# Patient Record
Sex: Female | Born: 1965 | Race: White | Hispanic: No | Marital: Married | Smoking: Never smoker
Health system: Southern US, Community
[De-identification: ages and names within clinical notes are randomized; demographics above are authoritative.]

## PROBLEM LIST (undated history)

## (undated) DIAGNOSIS — K5792 Diverticulitis of intestine, part unspecified, without perforation or abscess without bleeding: Secondary | ICD-10-CM

## (undated) HISTORY — PX: TONSILLECTOMY: SUR1361

## (undated) HISTORY — PX: RADICAL HYSTERECTOMY: SHX2283

## (undated) HISTORY — PX: RHINOPLASTY: SUR1284

---

## 2005-12-01 ENCOUNTER — Other Ambulatory Visit: Payer: Self-pay

## 2005-12-01 ENCOUNTER — Inpatient Hospital Stay: Payer: Self-pay | Admitting: Internal Medicine

## 2006-05-12 ENCOUNTER — Ambulatory Visit: Payer: Self-pay | Admitting: Gastroenterology

## 2006-08-28 ENCOUNTER — Ambulatory Visit: Payer: Self-pay | Admitting: Family Medicine

## 2008-06-27 ENCOUNTER — Ambulatory Visit: Payer: Self-pay | Admitting: Obstetrics and Gynecology

## 2008-07-04 ENCOUNTER — Ambulatory Visit: Payer: Self-pay | Admitting: Obstetrics and Gynecology

## 2010-01-03 ENCOUNTER — Inpatient Hospital Stay (HOSPITAL_COMMUNITY): Admission: RE | Admit: 2010-01-03 | Discharge: 2010-01-04 | Payer: Self-pay | Admitting: Neurosurgery

## 2010-09-09 LAB — SURGICAL PCR SCREEN
MRSA, PCR: POSITIVE — AB
Staphylococcus aureus: POSITIVE — AB

## 2010-09-09 LAB — CBC
HCT: 39 % (ref 36.0–46.0)
Hemoglobin: 12.9 g/dL (ref 12.0–15.0)
Platelets: 167 10*3/uL (ref 150–400)
RBC: 4.38 MIL/uL (ref 3.87–5.11)
RDW: 12.6 % (ref 11.5–15.5)

## 2010-09-25 ENCOUNTER — Ambulatory Visit: Payer: Self-pay | Admitting: Obstetrics and Gynecology

## 2012-10-07 ENCOUNTER — Ambulatory Visit: Payer: Self-pay | Admitting: Family Medicine

## 2013-01-12 ENCOUNTER — Ambulatory Visit: Payer: Self-pay | Admitting: Gastroenterology

## 2013-11-24 DIAGNOSIS — E785 Hyperlipidemia, unspecified: Secondary | ICD-10-CM | POA: Insufficient documentation

## 2013-11-24 DIAGNOSIS — N920 Excessive and frequent menstruation with regular cycle: Secondary | ICD-10-CM | POA: Insufficient documentation

## 2013-11-24 DIAGNOSIS — R51 Headache: Secondary | ICD-10-CM

## 2013-11-24 DIAGNOSIS — R519 Headache, unspecified: Secondary | ICD-10-CM | POA: Insufficient documentation

## 2013-11-24 DIAGNOSIS — N83209 Unspecified ovarian cyst, unspecified side: Secondary | ICD-10-CM | POA: Insufficient documentation

## 2014-10-17 ENCOUNTER — Ambulatory Visit
Admit: 2014-10-17 | Disposition: A | Discharge status: Home / Self Care | Payer: Self-pay | Attending: Neurology | Admitting: Neurology

## 2016-11-07 ENCOUNTER — Other Ambulatory Visit: Payer: Self-pay | Admitting: Neurology

## 2016-11-07 ENCOUNTER — Encounter: Payer: Self-pay | Admitting: Emergency Medicine

## 2016-11-07 DIAGNOSIS — R51 Headache: Secondary | ICD-10-CM

## 2016-11-07 DIAGNOSIS — M542 Cervicalgia: Secondary | ICD-10-CM

## 2016-11-07 DIAGNOSIS — R200 Anesthesia of skin: Secondary | ICD-10-CM

## 2016-11-07 DIAGNOSIS — R110 Nausea: Secondary | ICD-10-CM | POA: Diagnosis not present

## 2016-11-07 DIAGNOSIS — R1030 Lower abdominal pain, unspecified: Secondary | ICD-10-CM | POA: Diagnosis present

## 2016-11-07 DIAGNOSIS — K5732 Diverticulitis of large intestine without perforation or abscess without bleeding: Secondary | ICD-10-CM | POA: Diagnosis not present

## 2016-11-07 DIAGNOSIS — R519 Headache, unspecified: Secondary | ICD-10-CM

## 2016-11-07 LAB — URINALYSIS, COMPLETE (UACMP) WITH MICROSCOPIC
BILIRUBIN URINE: NEGATIVE
Bacteria, UA: NONE SEEN
GLUCOSE, UA: NEGATIVE mg/dL
Hgb urine dipstick: NEGATIVE
Ketones, ur: NEGATIVE mg/dL
Nitrite: NEGATIVE
PH: 5 (ref 5.0–8.0)
Protein, ur: NEGATIVE mg/dL
SPECIFIC GRAVITY, URINE: 1.023 (ref 1.005–1.030)

## 2016-11-07 LAB — COMPREHENSIVE METABOLIC PANEL
ALK PHOS: 78 U/L (ref 38–126)
ALT: 18 U/L (ref 14–54)
AST: 24 U/L (ref 15–41)
Albumin: 4.2 g/dL (ref 3.5–5.0)
Anion gap: 6 (ref 5–15)
BILIRUBIN TOTAL: 0.6 mg/dL (ref 0.3–1.2)
BUN: 16 mg/dL (ref 6–20)
CALCIUM: 9.4 mg/dL (ref 8.9–10.3)
CHLORIDE: 103 mmol/L (ref 101–111)
CO2: 30 mmol/L (ref 22–32)
CREATININE: 0.92 mg/dL (ref 0.44–1.00)
Glucose, Bld: 105 mg/dL — ABNORMAL HIGH (ref 65–99)
Potassium: 3.9 mmol/L (ref 3.5–5.1)
Sodium: 139 mmol/L (ref 135–145)
Total Protein: 7.5 g/dL (ref 6.5–8.1)

## 2016-11-07 LAB — LIPASE, BLOOD: Lipase: 28 U/L (ref 11–51)

## 2016-11-07 LAB — CBC
HCT: 42.6 % (ref 35.0–47.0)
Hemoglobin: 14.4 g/dL (ref 12.0–16.0)
MCH: 29.2 pg (ref 26.0–34.0)
MCHC: 33.8 g/dL (ref 32.0–36.0)
MCV: 86.1 fL (ref 80.0–100.0)
PLATELETS: 187 10*3/uL (ref 150–440)
RBC: 4.94 MIL/uL (ref 3.80–5.20)
RDW: 13.3 % (ref 11.5–14.5)
WBC: 11.7 10*3/uL — AB (ref 3.6–11.0)

## 2016-11-07 NOTE — ED Notes (Signed)
Lab requests to resend urine sample, not enough received

## 2016-11-07 NOTE — ED Triage Notes (Signed)
Pt ambulatory to triage with steady gait. Pt presents to ED c/o lower abdominal pain today, Pt reports took magnesium citrate, had 3 normal BM. Pt reports hx of diverticulitis. Pt denies vomiting or diarrhea, denies chest pain.

## 2016-11-08 ENCOUNTER — Emergency Department
Admission: EM | Admit: 2016-11-08 | Discharge: 2016-11-08 | Disposition: A | Discharge status: Home / Self Care | Payer: Managed Care, Other (non HMO) | Attending: Emergency Medicine | Admitting: Emergency Medicine

## 2016-11-08 ENCOUNTER — Emergency Department: Payer: Managed Care, Other (non HMO)

## 2016-11-08 DIAGNOSIS — K5792 Diverticulitis of intestine, part unspecified, without perforation or abscess without bleeding: Secondary | ICD-10-CM

## 2016-11-08 HISTORY — DX: Diverticulitis of intestine, part unspecified, without perforation or abscess without bleeding: K57.92

## 2016-11-08 MED ORDER — MORPHINE SULFATE (PF) 4 MG/ML IV SOLN
4.0000 mg | Freq: Once | INTRAVENOUS | Status: AC
  Administered 2016-11-08: 4 mg via INTRAVENOUS
  Filled 2016-11-08: qty 1

## 2016-11-08 MED ORDER — IOPAMIDOL (ISOVUE-300) INJECTION 61%
30.0000 mL | Freq: Once | INTRAVENOUS | Status: AC | PRN
  Administered 2016-11-08: 30 mL via ORAL

## 2016-11-08 MED ORDER — METRONIDAZOLE 500 MG PO TABS: 500.0000 mg | ORAL_TABLET | Freq: Two times a day (BID) | ORAL | 0 refills | Status: AC

## 2016-11-08 MED ORDER — CIPROFLOXACIN IN D5W 400 MG/200ML IV SOLN
400.0000 mg | Freq: Once | INTRAVENOUS | Status: AC
  Administered 2016-11-08: 400 mg via INTRAVENOUS
  Filled 2016-11-08: qty 200

## 2016-11-08 MED ORDER — OXYCODONE-ACETAMINOPHEN 5-325 MG PO TABS: 1.0000 | ORAL_TABLET | Freq: Four times a day (QID) | ORAL | 0 refills | Status: DC | PRN

## 2016-11-08 MED ORDER — CIPROFLOXACIN HCL 500 MG PO TABS: 500.0000 mg | ORAL_TABLET | Freq: Two times a day (BID) | ORAL | 0 refills | Status: AC

## 2016-11-08 MED ORDER — OXYCODONE-ACETAMINOPHEN 5-325 MG PO TABS
1.0000 | ORAL_TABLET | Freq: Once | ORAL | Status: AC
  Administered 2016-11-08: 1 via ORAL
  Filled 2016-11-08: qty 1

## 2016-11-08 MED ORDER — IOPAMIDOL (ISOVUE-300) INJECTION 61%
100.0000 mL | Freq: Once | INTRAVENOUS | Status: AC | PRN
  Administered 2016-11-08: 100 mL via INTRAVENOUS

## 2016-11-08 MED ORDER — ONDANSETRON HCL 4 MG/2ML IJ SOLN
4.0000 mg | Freq: Once | INTRAMUSCULAR | Status: AC
  Administered 2016-11-08: 4 mg via INTRAVENOUS
  Filled 2016-11-08: qty 2

## 2016-11-08 MED ORDER — METRONIDAZOLE 500 MG PO TABS
500.0000 mg | ORAL_TABLET | Freq: Once | ORAL | Status: AC
  Administered 2016-11-08: 500 mg via ORAL
  Filled 2016-11-08: qty 1

## 2016-11-08 NOTE — ED Notes (Signed)
CT tech, Pam, notified pt is finished drinking contrast for CT test.

## 2016-11-08 NOTE — Discharge Instructions (Signed)
Please return with worsening pain or vomiting

## 2016-11-08 NOTE — ED Provider Notes (Signed)
[facility] Emergency Department Provider Note   ____________________________________________   First MD Initiated Contact with Patient 11/08/16 0231     (approximate)  I have reviewed the triage vital signs and the nursing notes.   HISTORY  Chief Complaint Abdominal Pain    HPI Katie Sanchez is a 51 y.o. female who comes into the hospital today with abdominal pain. She is unsure if she has a bowel obstruction or if it's due to diverticulitis. She's had some sharp pains throughout the bottom of her stomach. The patient states symptoms started 1-2 days ago. The patient reports that she's had some neck issues going on as well. The patient hasn't taken anything for pain at home. She reports that she took some magnesium citrate and had 3 bowel movements. She reports that the pain did not improve. The patient was up in 8 out of 10 in intensity. She's had some nausea with no vomiting. The patient denies any fever, lightheadedness, dizziness. She reports his never been like this across her abdomen. She reports that the pain is worse on the right greater than left.   Past Medical History:  Diagnosis Date  . Diverticulitis     There are no active problems to display for this patient.   Past Surgical History:  Procedure Laterality Date  . RADICAL HYSTERECTOMY    . RHINOPLASTY    . TONSILLECTOMY      Prior to Admission medications   Medication Sig Start Date End Date Taking? Authorizing Provider  ciprofloxacin (CIPRO) 500 MG tablet Take 1 tablet (500 mg total) by mouth 2 (two) times daily. 11/08/16 11/15/16  Rebecka ApleyWebster, Daquane Aguilar P, MD  metroNIDAZOLE (FLAGYL) 500 MG tablet Take 1 tablet (500 mg total) by mouth 2 (two) times daily. 11/08/16 11/15/16  Rebecka ApleyWebster, Stanley Lyness P, MD  oxyCODONE-acetaminophen (ROXICET) 5-325 MG tablet Take 1 tablet by mouth every 6 (six) hours as needed. 11/08/16   Rebecka ApleyWebster, Kearsten Ginther P, MD    Allergies Augmentin [amoxicillin-pot  clavulanate]  No family history on file.  Social History Social History  Substance Use Topics  . Smoking status: Never Smoker  . Smokeless tobacco: Never Used  . Alcohol use Yes    Review of Systems  Constitutional: No fever/chills Eyes: No visual changes. ENT: No sore throat. Cardiovascular: Denies chest pain. Respiratory: Denies shortness of breath. Gastrointestinal: abdominal pain.  nausea, no vomiting.  No diarrhea.  No constipation. Genitourinary: Negative for dysuria. Musculoskeletal: Negative for back pain. Skin: Negative for rash. Neurological: Negative for headaches, focal weakness or numbness.   ____________________________________________   PHYSICAL EXAM:  VITAL SIGNS: ED Triage Vitals  Enc Vitals Group     BP 11/07/16 2227 (!) 152/86     Pulse Rate 11/07/16 2227 90     Resp 11/07/16 2227 18     Temp 11/07/16 2227 98.6 F (37 C)     Temp Source 11/07/16 2227 Oral     SpO2 11/07/16 2227 97 %     Weight 11/07/16 2228 158 lb (71.7 kg)     Height 11/07/16 2228 5\' 4"  (1.626 m)     Head Circumference --      Peak Flow --      Pain Score 11/07/16 2227 8     Pain Loc --      Pain Edu? --      Excl. in [place]? --     Constitutional: Alert and oriented. Well appearing and in Moderate distress. Eyes: Conjunctivae are normal. PERRL. EOMI.  Head: Atraumatic. Nose: No congestion/rhinnorhea. Mouth/Throat: Mucous membranes are moist.  Oropharynx non-erythematous. Cardiovascular: Normal rate, regular rhythm. Grossly normal heart sounds.  Good peripheral circulation. Respiratory: Normal respiratory effort.  No retractions. Lungs CTAB. Gastrointestinal: Soft with some right lower quadrant and left lower quadrant tenderness to palpation. No distention. Positive bowel sounds Musculoskeletal: No lower extremity tenderness nor edema.   Neurologic:  Normal speech and language.  Skin:  Skin is warm, dry and intact.  Psychiatric: Mood and affect are normal.    ____________________________________________   LABS (all labs ordered are listed, but only abnormal results are displayed)  Labs Reviewed  COMPREHENSIVE METABOLIC PANEL - Abnormal; Notable for the following:       Result Value   Glucose, Bld 105 (*)    All other components within normal limits  CBC - Abnormal; Notable for the following:    WBC 11.7 (*)    All other components within normal limits  URINALYSIS, COMPLETE (UACMP) WITH MICROSCOPIC - Abnormal; Notable for the following:    Color, Urine YELLOW (*)    APPearance CLEAR (*)    Leukocytes, UA SMALL (*)    Squamous Epithelial / LPF 0-5 (*)    All other components within normal limits  LIPASE, BLOOD   ____________________________________________  EKG  none ____________________________________________  RADIOLOGY  CT abd and pelvis ____________________________________________   PROCEDURES  Procedure(s) performed: None  Procedures  Critical Care performed: No  ____________________________________________   INITIAL IMPRESSION / ASSESSMENT AND PLAN / ED COURSE  Pertinent labs & imaging results that were available during my care of the patient were reviewed by me and considered in my medical decision making (see chart for details).  This is a 51 year old female who comes into the hospital today with abdominal pain. The patient received a CT scan and appears that she has some diverticulitis. She reports that after drinking the contrast she did vomit but she thinks it was discussed it was so much. The patient reports that she wants to attempt going home. I did give the patient is dose of Flagyl and ciprofloxacin. The patient will be discharged home to follow-up. I did give her dose of Percocet prior to her discharge.      ____________________________________________   FINAL CLINICAL IMPRESSION(S) / ED DIAGNOSES  Final diagnoses:  Diverticulitis      NEW MEDICATIONS STARTED DURING THIS VISIT:  New  Prescriptions   CIPROFLOXACIN (CIPRO) 500 MG TABLET    Take 1 tablet (500 mg total) by mouth 2 (two) times daily.   METRONIDAZOLE (FLAGYL) 500 MG TABLET    Take 1 tablet (500 mg total) by mouth 2 (two) times daily.   OXYCODONE-ACETAMINOPHEN (ROXICET) 5-325 MG TABLET    Take 1 tablet by mouth every 6 (six) hours as needed.     Note:  This document was prepared using Dragon voice recognition software and may include unintentional dictation errors.    Rebecka Apley, MD 11/08/16 [phone]

## 2016-11-08 NOTE — ED Notes (Signed)

## 2016-11-19 ENCOUNTER — Ambulatory Visit
Admission: RE | Admit: 2016-11-19 | Discharge: 2016-11-19 | Disposition: A | Discharge status: Home / Self Care | Payer: Managed Care, Other (non HMO) | Source: Ambulatory Visit | Attending: Neurology | Admitting: Neurology

## 2016-11-19 DIAGNOSIS — M50321 Other cervical disc degeneration at C4-C5 level: Secondary | ICD-10-CM | POA: Diagnosis not present

## 2016-11-19 DIAGNOSIS — R51 Headache: Secondary | ICD-10-CM | POA: Insufficient documentation

## 2016-11-19 DIAGNOSIS — R519 Headache, unspecified: Secondary | ICD-10-CM

## 2016-11-19 DIAGNOSIS — R200 Anesthesia of skin: Secondary | ICD-10-CM | POA: Diagnosis present

## 2016-11-19 DIAGNOSIS — M542 Cervicalgia: Secondary | ICD-10-CM

## 2016-11-19 DIAGNOSIS — M5382 Other specified dorsopathies, cervical region: Secondary | ICD-10-CM | POA: Diagnosis not present

## 2016-11-19 DIAGNOSIS — Z981 Arthrodesis status: Secondary | ICD-10-CM | POA: Diagnosis not present

## 2017-03-28 ENCOUNTER — Other Ambulatory Visit: Payer: Self-pay | Admitting: Family Medicine

## 2017-03-28 DIAGNOSIS — Z1231 Encounter for screening mammogram for malignant neoplasm of breast: Secondary | ICD-10-CM

## 2017-04-17 ENCOUNTER — Ambulatory Visit
Admission: RE | Admit: 2017-04-17 | Discharge: 2017-04-17 | Disposition: A | Discharge status: Home / Self Care | Payer: Managed Care, Other (non HMO) | Source: Ambulatory Visit | Attending: Family Medicine | Admitting: Family Medicine

## 2017-04-17 DIAGNOSIS — Z1231 Encounter for screening mammogram for malignant neoplasm of breast: Secondary | ICD-10-CM | POA: Insufficient documentation

## 2018-04-07 ENCOUNTER — Encounter: Payer: Self-pay | Admitting: Podiatry

## 2018-04-07 ENCOUNTER — Ambulatory Visit (INDEPENDENT_AMBULATORY_CARE_PROVIDER_SITE_OTHER): Payer: Managed Care, Other (non HMO) | Admitting: Podiatry

## 2018-04-07 VITALS — BP 143/85 | HR 82

## 2018-04-07 DIAGNOSIS — B351 Tinea unguium: Secondary | ICD-10-CM | POA: Diagnosis not present

## 2018-04-07 MED ORDER — TERBINAFINE HCL 250 MG PO TABS: 250.0000 mg | ORAL_TABLET | Freq: Every day | ORAL | 0 refills | Status: DC

## 2018-04-10 NOTE — Progress Notes (Signed)
   Subjective: 52 year old female presenting today as a new patient with a chief complaint of nail fungus noted to the right hallux and bilateral 2nd toes that has been ongoing for the past 1-2 years. She reports associated thickening and discoloration of the nails. She was evaluated by her PCP and was prescribed antifungal tablets that she states only helped improve the 2nd toenails. There are no modifying factors noted. Patient is here for further evaluation and treatment.   Past Medical History:  Diagnosis Date  . Diverticulitis     Objective: Physical Exam General: The patient is alert and oriented x3 in no acute distress.  Dermatology: Hyperkeratotic, discolored, thickened, onychodystrophy of 2nd toenails noted bilaterally and right hallux. Skin is warm, dry and supple bilateral lower extremities. Negative for open lesions or macerations.  Vascular: Palpable pedal pulses bilaterally. No edema or erythema noted. Capillary refill within normal limits.  Neurological: Epicritic and protective threshold grossly intact bilaterally.   Musculoskeletal Exam: Range of motion within normal limits to all pedal and ankle joints bilateral. Muscle strength 5/5 in all groups bilateral.   Assessment: #1 onychomycosis right hallux and bilateral 2nd toenails #2 hyperkeratotic nails   Plan of Care:  #1 Patient was evaluated. #2 Prescription for Lamisil 250 mg #90 provided to patient.  #3 LFT was WNL on 03/27/18 at [facility].  #4 Return to clinic as needed.    Felecia Shelling, DPM [facility]  Dr. Felecia Shelling, DPM    [address]                                        [place], [place] [address]                Office [phone]  Fax [phone]

## 2019-06-04 ENCOUNTER — Emergency Department: Payer: PRIVATE HEALTH INSURANCE

## 2019-06-04 ENCOUNTER — Other Ambulatory Visit: Payer: Self-pay

## 2019-06-04 ENCOUNTER — Emergency Department
Admission: EM | Admit: 2019-06-04 | Discharge: 2019-06-05 | Discharge status: Against Medical Advice | Payer: PRIVATE HEALTH INSURANCE | Attending: Emergency Medicine | Admitting: Emergency Medicine

## 2019-06-04 DIAGNOSIS — Z79899 Other long term (current) drug therapy: Secondary | ICD-10-CM | POA: Diagnosis not present

## 2019-06-04 DIAGNOSIS — M79602 Pain in left arm: Secondary | ICD-10-CM | POA: Diagnosis not present

## 2019-06-04 DIAGNOSIS — R202 Paresthesia of skin: Secondary | ICD-10-CM | POA: Insufficient documentation

## 2019-06-04 DIAGNOSIS — R200 Anesthesia of skin: Secondary | ICD-10-CM | POA: Insufficient documentation

## 2019-06-04 DIAGNOSIS — R519 Headache, unspecified: Secondary | ICD-10-CM | POA: Diagnosis not present

## 2019-06-04 LAB — CBC
HCT: 44.7 % (ref 36.0–46.0)
Hemoglobin: 14.8 g/dL (ref 12.0–15.0)
MCH: 28.7 pg (ref 26.0–34.0)
MCHC: 33.1 g/dL (ref 30.0–36.0)
MCV: 86.8 fL (ref 80.0–100.0)
Platelets: 190 10*3/uL (ref 150–400)
RBC: 5.15 MIL/uL — ABNORMAL HIGH (ref 3.87–5.11)
RDW: 12.7 % (ref 11.5–15.5)
WBC: 5.2 10*3/uL (ref 4.0–10.5)
nRBC: 0 % (ref 0.0–0.2)

## 2019-06-04 LAB — COMPREHENSIVE METABOLIC PANEL
ALT: 15 U/L (ref 0–44)
AST: 19 U/L (ref 15–41)
Albumin: 3.8 g/dL (ref 3.5–5.0)
Alkaline Phosphatase: 74 U/L (ref 38–126)
Anion gap: 8 (ref 5–15)
BUN: 14 mg/dL (ref 6–20)
CO2: 26 mmol/L (ref 22–32)
Calcium: 9.1 mg/dL (ref 8.9–10.3)
Chloride: 106 mmol/L (ref 98–111)
Creatinine, Ser: 0.8 mg/dL (ref 0.44–1.00)
GFR calc Af Amer: >60 mL/min (ref >60)
GFR calc non Af Amer: >60 mL/min (ref >60)
Glucose, Bld: 99 mg/dL (ref 70–99)
Potassium: 4 mmol/L (ref 3.5–5.1)
Sodium: 140 mmol/L (ref 135–145)
Total Bilirubin: 0.9 mg/dL (ref 0.3–1.2)
Total Protein: 7.2 g/dL (ref 6.5–8.1)

## 2019-06-04 LAB — PROTIME-INR
INR: 0.9 (ref 0.8–1.2)
Prothrombin Time: 12.4 seconds (ref 11.4–15.2)

## 2019-06-04 LAB — DIFFERENTIAL
Abs Immature Granulocytes: 0.02 10*3/uL (ref 0.00–0.07)
Basophils Absolute: 0 10*3/uL (ref 0.0–0.1)
Basophils Relative: 0 %
Eosinophils Absolute: 0.1 10*3/uL (ref 0.0–0.5)
Eosinophils Relative: 2 %
Immature Granulocytes: 0 %
Lymphocytes Relative: 23 %
Lymphs Abs: 1.2 10*3/uL (ref 0.7–4.0)
Monocytes Absolute: 0.3 10*3/uL (ref 0.1–1.0)
Monocytes Relative: 6 %
Neutro Abs: 3.6 10*3/uL (ref 1.7–7.7)
Neutrophils Relative %: 69 %

## 2019-06-04 LAB — APTT: aPTT: 29 seconds (ref 24–36)

## 2019-06-04 NOTE — ED Provider Notes (Signed)
-----------------------------------------   11:14 PM on 06/04/2019 -----------------------------------------  Assuming care from Dr. Cinda Quest.  In short, Katie Sanchez is a 53 y.o. female with a chief complaint of numbness in left arm.  Refer to the original H&P for additional details.  The current plan of care is to follow up C-spine MRI.  Anticipate d/c if MRI unremarkable.   ----------------------------------------- 12:41 AM on 06/05/2019 -----------------------------------------  I received word from the ED nurse that the patient eloped from the emergency department without informing anyone of her plan to depart.  There was no evidence of an emergent medical condition and she does not need to be called back at this time.  She has outpatient neurologist and primary care and the ability to follow-up.  She was also told by Dr. Cinda Quest that she needs to follow-up with an outpatient neurologist.   Hinda Kehr, MD 06/05/19 [phone]

## 2019-06-04 NOTE — ED Provider Notes (Addendum)
[facility] Emergency Department Provider Note   ____________________________________________   First MD Initiated Contact with Patient 06/04/19 2208     (approximate)  I have reviewed the triage vital signs and the nursing notes.   HISTORY  Chief Complaint Numbness    HPI Katie Sanchez is a 53 y.o. female who reports she sees neurology for occipital neuralgia and left arm pain.  The left arm pain is usually in a stripe along the outer surface of the arm going down into the ulnar distribution on the hand and fingers.  Today however the arm became numb all the way around from the shoulder down.  Additionally she had numbness of the lips all the way around.  She said that her doctor had told her that if she ever got numbness on the face she should go to the ER.  Here in the emergency room she continues to have numbness of the arm but no weakness or clumsiness.  MRI and CT of the head are negative.         Past Medical History:  Diagnosis Date  . Diverticulitis     Patient Active Problem List   Diagnosis Date Noted  . Excessive and frequent menstruation 11/24/2013  . Headache 11/24/2013  . Other and unspecified hyperlipidemia 11/24/2013  . Other and unspecified ovarian cyst 11/24/2013    Past Surgical History:  Procedure Laterality Date  . RADICAL HYSTERECTOMY    . RHINOPLASTY    . TONSILLECTOMY      Prior to Admission medications   Medication Sig Start Date End Date Taking? Authorizing Provider  baclofen (LIORESAL) 10 MG tablet Take by mouth. 10/28/16   [provider]  cetirizine (ZYRTEC) 10 MG tablet Take by mouth.    [provider]  desvenlafaxine (PRISTIQ) 50 MG 24 hr tablet Take by mouth.    [provider]  Dexmethylphenidate HCl 30 MG CP24 Take by mouth.    [provider]  gabapentin (NEURONTIN) 100 MG capsule Take by mouth. 06/02/15   [provider]  ibuprofen (ADVIL,MOTRIN)  200 MG tablet Take by mouth.    [provider]  Lactobacillus-Inulin (CULTURELLE DIGESTIVE HEALTH) CAPS Take by mouth.    [provider]  oxyCODONE-acetaminophen (ROXICET) 5-325 MG tablet Take 1 tablet by mouth every 6 (six) hours as needed. 11/08/16   Rebecka ApleyWebster, Allison P, MD  terbinafine (LAMISIL) 250 MG tablet Take 1 tablet (250 mg total) by mouth daily. 04/07/18   Felecia ShellingEvans, Brent M, DPM  vitamin B-12 (CYANOCOBALAMIN) 1000 MCG tablet Take by mouth.    [provider]    Allergies Augmentin [amoxicillin-pot clavulanate]  Family History  Problem Relation Age of Onset  . Breast cancer Maternal Grandmother 4560    Social History Social History   Tobacco Use  . Smoking status: Never Smoker  . Smokeless tobacco: Never Used  Substance Use Topics  . Alcohol use: Yes  . Drug use: No    Review of Systems  Constitutional: No fever/chills Eyes: No visual changes. ENT: No sore throat. Cardiovascular: Denies chest pain. Respiratory: Denies shortness of breath. Gastrointestinal: No abdominal pain.  No nausea, no vomiting.  No diarrhea.  No constipation. Genitourinary: Negative for dysuria. Musculoskeletal: Negative for back pain. Skin: Negative for rash. Neurological: Negative for focal weakness   ____________________________________________   PHYSICAL EXAM:  VITAL SIGNS: ED Triage Vitals [06/04/19 1614]  Enc Vitals Group     BP (!) 142/91     Pulse Rate  73     Resp 18     Temp 98.8 F (37.1 C)     Temp Source Oral     SpO2 98 %     Weight 170 lb (77.1 kg)     Height 5\' 4"  (1.626 m)     Head Circumference      Peak Flow      Pain Score 10     Pain Loc      Pain Edu?      Excl. in [place]?     Constitutional: Alert and oriented. Well appearing and in no acute distress. Eyes: Conjunctivae are normal.  Head: Atraumatic. Nose: No congestion/rhinnorhea. Mouth/Throat: Mucous membranes are moist.  Oropharynx non-erythematous. Neck: No stridor.    Cardiovascular: Normal rate, regular rhythm. Grossly normal heart sounds.  Good peripheral circulation. Respiratory: Normal respiratory effort.  No retractions. Lungs CTAB. Gastrointestinal: Soft and nontender. No distention. No abdominal bruits. No CVA tenderness. Musculoskeletal: No lower extremity tenderness nor edema.  Neurologic:  Normal speech and language. No gross focal neurologic deficits are appreciated.  There is no facial asymmetry or limitation of ocular movement patient's mouth and tongue are functioning normally there is no facial droop.  There is no numbness anywhere except for around her lips.  Arms motor strength and sensation are normal except as described in HPI legs the same.  There is no ataxia on testing of the hands finger-to-nose and upper alternating movements Skin:  Skin is warm, dry and intact. No rash noted. Psychiatric: Mood and affect are normal. Speech and behavior are normal.  ____________________________________________   LABS (all labs ordered are listed, but only abnormal results are displayed)  Labs Reviewed  CBC - Abnormal; Notable for the following components:      Result Value   RBC 5.15 (*)    All other components within normal limits  PROTIME-INR  APTT  DIFFERENTIAL  COMPREHENSIVE METABOLIC PANEL   ____________________________________________  EKG   ____________________________________________  RADIOLOGY  ED MD interpretation: CT of the head and neck are read as no acute changes by radiology.  Same with the MRI.  Official radiology report(s): CT HEAD WO CONTRAST  Result Date: 06/04/2019 CLINICAL DATA:  Possible stroke; numbness or tingling, paresthesia. Additional history provided: Patient reports facial numbness, head numbness and left arm numbness since 2:30 p.m. EXAM: CT HEAD WITHOUT CONTRAST TECHNIQUE: Contiguous axial images were obtained from the base of the skull through the vertex without intravenous contrast. COMPARISON:   Brain MRI 10/17/2014, head CT 08/28/2006 FINDINGS: Brain: No evidence of acute intracranial hemorrhage. No demarcated cortical infarction. No evidence of intracranial mass. No midline shift or extra-axial fluid collection. Cerebral volume is normal for age. Vascular: No hyperdense vessel. Skull: Normal. Negative for fracture or focal lesion. Sinuses/Orbits: Visualized orbits demonstrate no acute abnormality. ild mucosal thickening within posterior right ethmoid air cells. No significant mastoid effusion. IMPRESSION: No CT evidence of acute intracranial abnormality. Electronically Signed   By: Kellie Simmering DO   On: 06/04/2019 16:54   MR BRAIN WO CONTRAST  Result Date: 06/04/2019 CLINICAL DATA:  Numbness and tingling.  Paresthesia. EXAM: MRI HEAD WITHOUT CONTRAST TECHNIQUE: Multiplanar, multiecho pulse sequences of the brain and surrounding structures were obtained without intravenous contrast. COMPARISON:  CT head 06/04/2019 FINDINGS: Brain: No acute infarction, hemorrhage, hydrocephalus, extra-axial collection or mass lesion. Normal white matter. Negative for demyelinating disease. Vascular: Normal arterial flow voids. Skull and upper cervical spine: Negative Sinuses/Orbits: Mild mucosal edema paranasal sinuses.  Normal orbit Other:  None IMPRESSION: Normal MRI of the brain.  Normal white matter Electronically Signed   By: Marlan Palau M.D.   On: 06/04/2019 20:59    ____________________________________________   PROCEDURES  Procedure(s) performed (including Critical Care):  Procedures   ____________________________________________   INITIAL IMPRESSION / ASSESSMENT AND PLAN / ED COURSE  I will get an MRI of the patient's neck.  If that is negative I will have the patient follow-up with her regular doctor.  Negative MRI of the head neck would make stroke or multiple sclerosis or any other such kind of illness very unlikely.    Lorece Dawn Macie Baum was evaluated in Emergency Department  on 06/04/2019 for the symptoms described in the history of present illness. She was evaluated in the context of the global COVID-19 pandemic, which necessitated consideration that the patient might be at risk for infection with the SARS-CoV-2 virus that causes COVID-19. Institutional protocols and algorithms that pertain to the evaluation of patients at risk for COVID-19 are in a state of rapid change based on information released by regulatory bodies including the CDC and federal and state organizations. These policies and algorithms were followed during the patient's care in the ED.   ----------------------------------------- 12:32 AM on 06/05/2019 -----------------------------------------  MRI has not been done at this point I have signed the patient out to Dr. York Cerise  ----------------------------------------- 1:41 AM on 06/05/2019 -----------------------------------------  Patient's MRI shows no acute changes.  I will let her go and follow-up with neurology.     ____________________________________________   FINAL CLINICAL IMPRESSION(S) / ED DIAGNOSES  Final diagnoses:  Numbness     ED Discharge Orders    None       Note:  This document was prepared using Dragon voice recognition software and may include unintentional dictation errors.    Arnaldo Natal, MD 06/05/19 4540    Arnaldo Natal, MD 06/05/19 [phone]

## 2019-06-04 NOTE — ED Notes (Signed)
Pt reports  left sided facial and left arm, 'pinky and ring finger' numbness when going to sleep at 1230am 12/11. Pt st waking up at 1430pm w/facial/arm numbness   Pt st neck hurting for "couple of weeks". Pt st consistent headache due to "occipital neurologia"; pt st went to neurology due to headache and unable to "focus or concentrate". Pt st 'fussy" vision on left eye.

## 2019-06-04 NOTE — ED Triage Notes (Addendum)
Patient c/o facial numbness, head numbness and left arm numbness since 2:30pm. She is unsure when numbness started because she stated "It was going on while I had been sleeping but I am not sure what time it started because I stayed in bed until 2:30pm" Patient reports going to bed last night around 12pm. Patient had appointment today to get a nerve block and was told to come here after relaying about her numbness. HX occipital neuralgia. Speech clear. No facial droop. Grips strong and equal.

## 2019-06-05 ENCOUNTER — Emergency Department: Payer: PRIVATE HEALTH INSURANCE

## 2019-06-05 NOTE — ED Notes (Signed)
Attempted to round on pt and pt not in room.

## 2019-06-05 NOTE — ED Notes (Signed)
Unable to esign, no computer in the hallway available.

## 2019-06-05 NOTE — ED Notes (Addendum)
Discharge instructions reviewed with pt, pt verbalized understanding. Denies pain, states no change in numbness, pt aware to come to ED or see primary MD if numbness increases.

## 2019-06-05 NOTE — Discharge Instructions (Addendum)
The CT of your head and the MRI of your brain and neck do not show any new changes.  There is nothing to explain the change in the numbness.  Please return for any increasing numbness or weakness.  Please follow-up with your neurologist in the next week or so.

## 2019-06-07 ENCOUNTER — Other Ambulatory Visit: Payer: Self-pay | Admitting: Neurology

## 2019-06-07 DIAGNOSIS — H93A9 Pulsatile tinnitus, unspecified ear: Secondary | ICD-10-CM

## 2019-06-07 DIAGNOSIS — R200 Anesthesia of skin: Secondary | ICD-10-CM

## 2019-06-16 ENCOUNTER — Ambulatory Visit: Payer: PRIVATE HEALTH INSURANCE

## 2019-06-17 ENCOUNTER — Encounter: Payer: Self-pay | Admitting: Intensive Care

## 2019-06-17 ENCOUNTER — Emergency Department: Payer: PRIVATE HEALTH INSURANCE

## 2019-06-17 ENCOUNTER — Emergency Department
Admission: EM | Admit: 2019-06-17 | Discharge: 2019-06-17 | Disposition: A | Discharge status: Home / Self Care | Payer: PRIVATE HEALTH INSURANCE | Attending: Student | Admitting: Student

## 2019-06-17 ENCOUNTER — Other Ambulatory Visit: Payer: Self-pay

## 2019-06-17 DIAGNOSIS — R509 Fever, unspecified: Secondary | ICD-10-CM | POA: Diagnosis present

## 2019-06-17 DIAGNOSIS — Z79899 Other long term (current) drug therapy: Secondary | ICD-10-CM | POA: Diagnosis not present

## 2019-06-17 DIAGNOSIS — B349 Viral infection, unspecified: Secondary | ICD-10-CM

## 2019-06-17 DIAGNOSIS — U071 COVID-19: Secondary | ICD-10-CM | POA: Insufficient documentation

## 2019-06-17 LAB — URINALYSIS, COMPLETE (UACMP) WITH MICROSCOPIC
Bacteria, UA: NONE SEEN
Bilirubin Urine: NEGATIVE
Glucose, UA: NEGATIVE mg/dL
Hgb urine dipstick: NEGATIVE
Ketones, ur: NEGATIVE mg/dL
Leukocytes,Ua: NEGATIVE
Nitrite: NEGATIVE
Protein, ur: NEGATIVE mg/dL
Specific Gravity, Urine: 1.012 (ref 1.005–1.030)
pH: 5 (ref 5.0–8.0)

## 2019-06-17 NOTE — ED Notes (Signed)
Unable to [facility] equipment malfunction

## 2019-06-17 NOTE — Discharge Instructions (Signed)
Take Tylenol or ibuprofen for body aches and/or fever. Follow-up with your primary care provider if you are not improving over the week. Return to the emergency department for symptoms that change or worsen if you are unable to schedule an appointment.

## 2019-06-17 NOTE — ED Provider Notes (Signed)
[facility] Emergency Department Provider Note  ____________________________________________  Time seen: Approximately 5:07 PM  I have reviewed the triage vital signs and the nursing notes.   HISTORY  Chief Complaint Fever, Headache, and Urinary Tract Infection   HPI Katie Sanchez is a 53 y.o. female presenting to the emergency department for treatment and evaluation of fever,cough, fatigue, and otalgia on the left. Brief exposure to COVID-19 about a week ago. Current symptoms started yesterday. She also states that she is having vivid nightmares and urinating frequently. No dysuria but would like urinalysis. No alleviating measures.   Past Medical History:  Diagnosis Date  . Diverticulitis     Patient Active Problem List   Diagnosis Date Noted  . Excessive and frequent menstruation 11/24/2013  . Headache 11/24/2013  . Other and unspecified hyperlipidemia 11/24/2013  . Other and unspecified ovarian cyst 11/24/2013    Past Surgical History:  Procedure Laterality Date  . RADICAL HYSTERECTOMY    . RHINOPLASTY    . TONSILLECTOMY      Prior to Admission medications   Medication Sig Start Date End Date Taking? Authorizing Provider  baclofen (LIORESAL) 10 MG tablet Take by mouth. 10/28/16   [provider]  cetirizine (ZYRTEC) 10 MG tablet Take by mouth.    [provider]  desvenlafaxine (PRISTIQ) 50 MG 24 hr tablet Take by mouth.    [provider]  Dexmethylphenidate HCl 30 MG CP24 Take by mouth.    [provider]  gabapentin (NEURONTIN) 100 MG capsule Take by mouth. 06/02/15   [provider]  ibuprofen (ADVIL,MOTRIN) 200 MG tablet Take by mouth.    [provider]  Lactobacillus-Inulin (CULTURELLE DIGESTIVE HEALTH) CAPS Take by mouth.    [provider]  oxyCODONE-acetaminophen (ROXICET) 5-325 MG tablet Take 1 tablet by mouth every 6 (six) hours as needed. 11/08/16   Rebecka Apley, MD  terbinafine (LAMISIL) 250 MG tablet Take 1 tablet (250 mg total) by mouth daily. 04/07/18   Felecia Shelling, DPM  vitamin B-12 (CYANOCOBALAMIN) 1000 MCG tablet Take by mouth.    [provider]    Allergies Augmentin [amoxicillin-pot clavulanate]  Family History  Problem Relation Age of Onset  . Breast cancer Maternal Grandmother 50    Social History Social History   Tobacco Use  . Smoking status: Never Smoker  . Smokeless tobacco: Never Used  Substance Use Topics  . Alcohol use: Yes    Comment: occ  . Drug use: No    Review of Systems Constitutional: Positive fever/chills. Decreased appetite. ENT: No sore throat. Cardiovascular: Denies chest pain. Respiratory: Negative for shortness of breath. Positive for cough. No wheezing.  Gastrointestinal: Negative nausea,  no vomiting.  No diarrhea.  Musculoskeletal: Positive for body aches Skin: Negative for rash. Neurological: Positive for headaches ____________________________________________   PHYSICAL EXAM:  VITAL SIGNS: ED Triage Vitals [06/17/19 1507]  Enc Vitals Group     BP 113/61     Pulse Rate 98     Resp 16     Temp 99.5 F (37.5 C)     Temp Source Oral     SpO2 99 %     Weight 170 lb (77.1 kg)     Height 5\' 4"  (1.626 m)     Head Circumference      Peak Flow      Pain Score 5     Pain Loc      Pain Edu?  Excl. in [place]?     Constitutional: Alert and oriented. Well appearing and in no acute distress. Eyes: Conjunctivae are normal. Ears: Left TM with fluid noted behind TM, no erythema. Nose: No sinus congestion noted; no rhinnorhea. Mouth/Throat: Mucous membranes are moist.  Oropharynx mildly erythematous. Tonsils flat. Uvula midline. Neck: No stridor.  Lymphatic: No cervical lymphadenopathy. Cardiovascular: Normal rate, regular rhythm. Good peripheral circulation. Respiratory: Respirations are even and unlabored.  No retractions. Breath sounds clear to  auscultation. Gastrointestinal: Soft and nontender.  Musculoskeletal: FROM x 4 extremities.  Neurologic:  Normal speech and language. Skin:  Skin is warm, dry and intact. No rash noted. Psychiatric: Mood and affect are normal. Speech and behavior are normal.  ____________________________________________   LABS (all labs ordered are listed, but only abnormal results are displayed)  Labs Reviewed  URINALYSIS, COMPLETE (UACMP) WITH MICROSCOPIC - Abnormal; Notable for the following components:      Result Value   Color, Urine YELLOW (*)    APPearance CLEAR (*)    All other components within normal limits  SARS CORONAVIRUS 2 (TAT 6-24 HRS)   ____________________________________________  EKG  Not indicated.  ____________________________________________  RADIOLOGY  Chest x-ray is negative for acute findings.  ____________________________________________   PROCEDURES  Procedure(s) performed: None  Critical Care performed: No ____________________________________________   INITIAL IMPRESSION / ASSESSMENT AND PLAN / ED COURSE  53 y.o. female presenting to the emergency department for treatment and evaluation of fever and other viral symptoms.  See HPI for further details.  Exam is overall reassuring.  Breath sounds are clear.  Chest x-ray is negative.  COVID-19 screening will be sent.  Urinalysis is not concerning for acute cystitis.  No antibiotics are indicated at this time.  Patient will be discharged home with instructions to quarantine until the Covid screening is back.  If positive she will be advised to quarantine until she has had no symptoms for 4 days.   Katie Sanchez was evaluated in Emergency Department on 06/17/2019 for the symptoms described in the history of present illness. She was evaluated in the context of the global COVID-19 pandemic, which necessitated consideration that the patient might be at risk for infection with the SARS-CoV-2 virus that  causes COVID-19. Institutional protocols and algorithms that pertain to the evaluation of patients at risk for COVID-19 are in a state of rapid change based on information released by regulatory bodies including the CDC and federal and state organizations. These policies and algorithms were followed during the patient's care in the ED.   Medications - No data to display  ED Discharge Orders    None       Pertinent labs & imaging results that were available during my care of the patient were reviewed by me and considered in my medical decision making (see chart for details).    If controlled substance prescribed during this visit, 12 month history viewed on the [place] prior to issuing an initial prescription for Schedule II or III opiod. ____________________________________________   FINAL CLINICAL IMPRESSION(S) / ED DIAGNOSES  Final diagnoses:  Acute viral syndrome    Note:  This document was prepared using Dragon voice recognition software and may include unintentional dictation errors.    Victorino Dike, FNP 06/17/19 1722    Lilia Pro., MD 06/17/19 [facility] Hare

## 2019-06-17 NOTE — ED Triage Notes (Signed)
PAtient c/o low grade fever, headache, and dry cough. Reports she wants a COVID test. Also wants to be check for a UTI

## 2019-06-17 NOTE — ED Notes (Signed)
Pt states fever of 100 + at home prior to arrival, took nothing for fever. Pt c/o cough x 3 days and believes it is due to allergy medication.

## 2019-06-18 LAB — SARS CORONAVIRUS 2 (TAT 6-24 HRS): SARS Coronavirus 2: POSITIVE — AB

## 2019-06-24 ENCOUNTER — Telehealth: Payer: Self-pay | Admitting: Emergency Medicine

## 2019-06-24 NOTE — Telephone Encounter (Signed)
Called to assure patient is aware of covid result. She is aware.

## 2020-05-15 ENCOUNTER — Other Ambulatory Visit: Payer: Self-pay | Admitting: Student

## 2020-05-15 DIAGNOSIS — Z1231 Encounter for screening mammogram for malignant neoplasm of breast: Secondary | ICD-10-CM

## 2021-04-26 IMAGING — CT CT HEAD W/O CM
3 series · 15 of 46 positions shown, 18 images · non-contrast
Comparison: Brain MRI 10/17/2014, head CT 08/28/2006

CLINICAL DATA: Possible stroke; numbness or tingling, paresthesia.
Additional history provided: Patient reports facial numbness, head
numbness and left arm numbness since [DATE] p.m.

EXAM:
CT HEAD WITHOUT CONTRAST
TECHNIQUE: Contiguous axial images were obtained from the base of the skull
through the vertex without intravenous contrast.

[Series 2: head wo · axial · 0.39mm/px · z∈[+659,+779]mm · 9 of 29 slices shown, 12 images]
[im 3/29  brain]
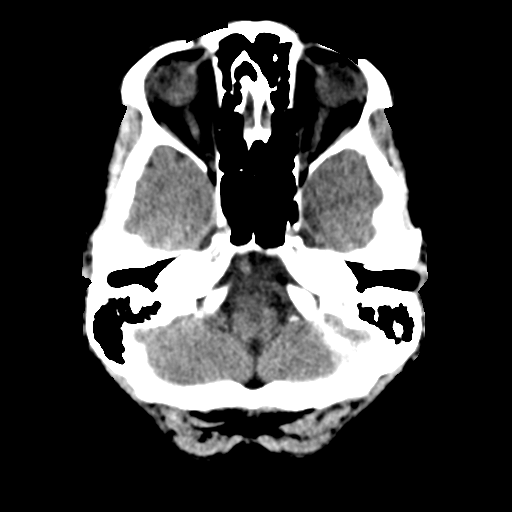
[im 3/29  bone]
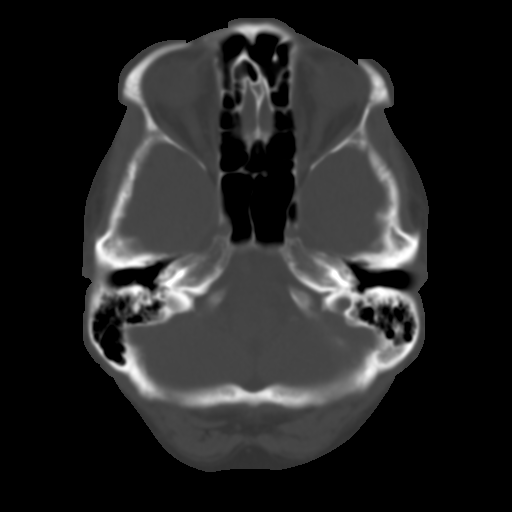
[im 6/29  brain]
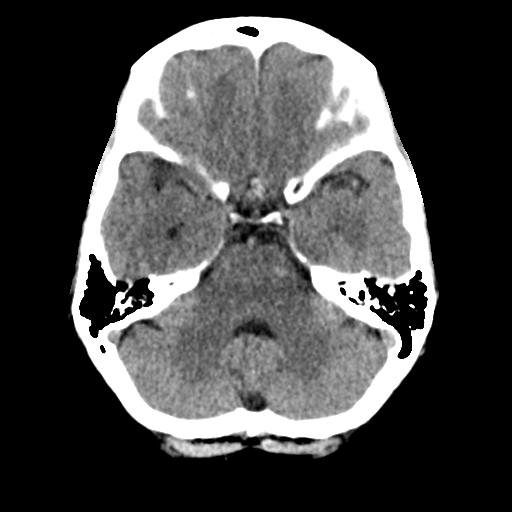
[im 9/29  brain]
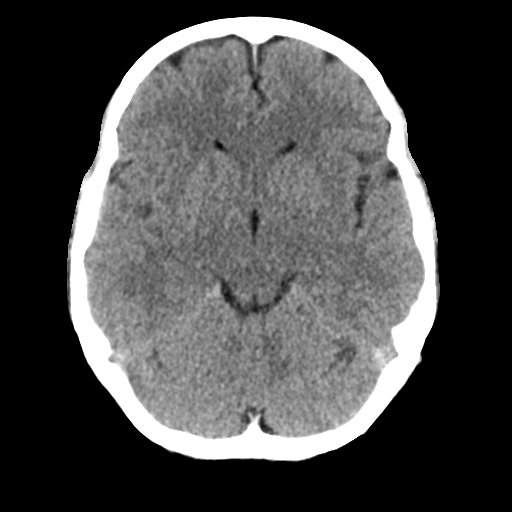
[im 12/29  brain]
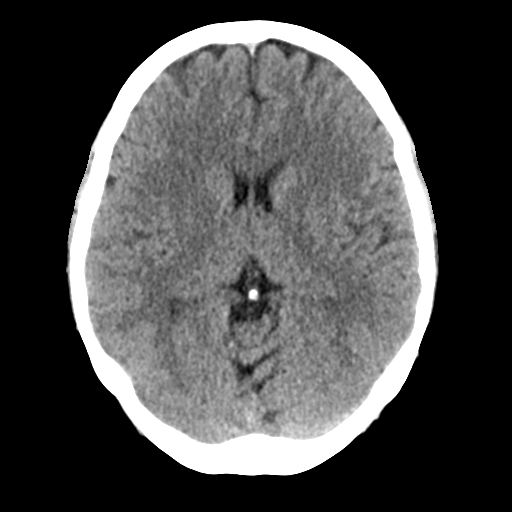
[im 15/29  brain]
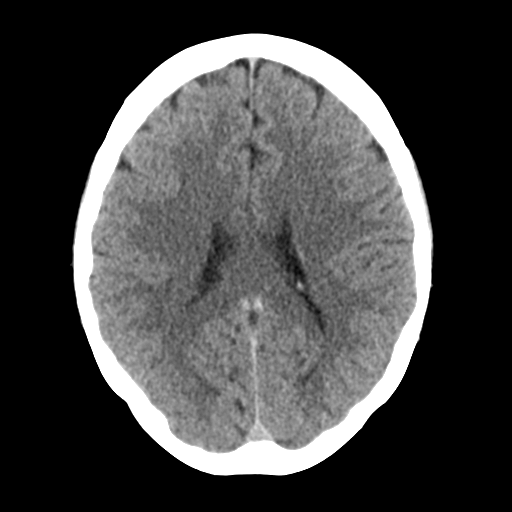
[im 15/29  bone]
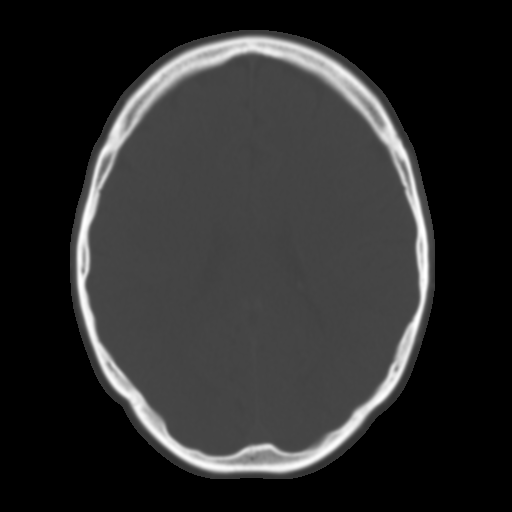
[im 18/29  brain]
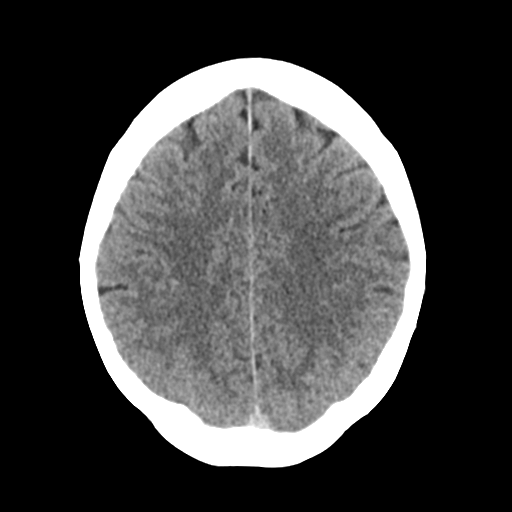
[im 21/29  brain]
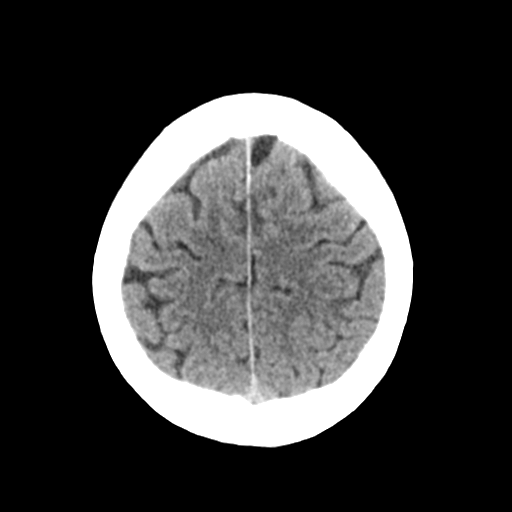
[im 24/29  brain]
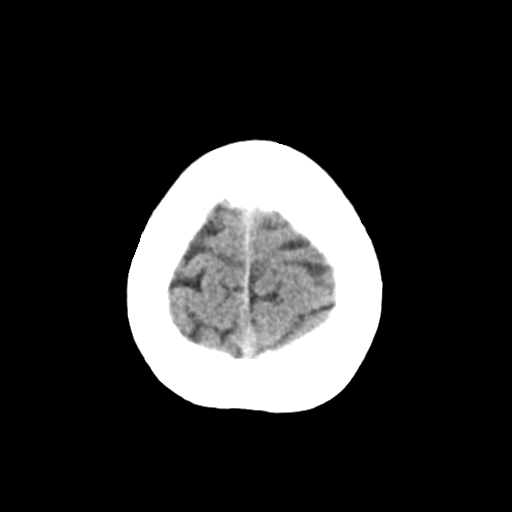
[im 27/29  brain]
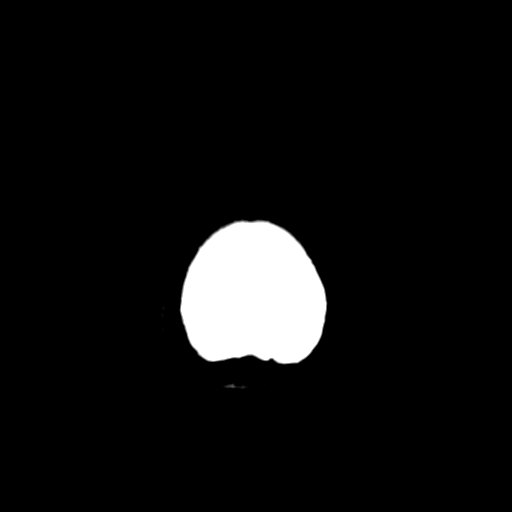
[im 27/29  bone]
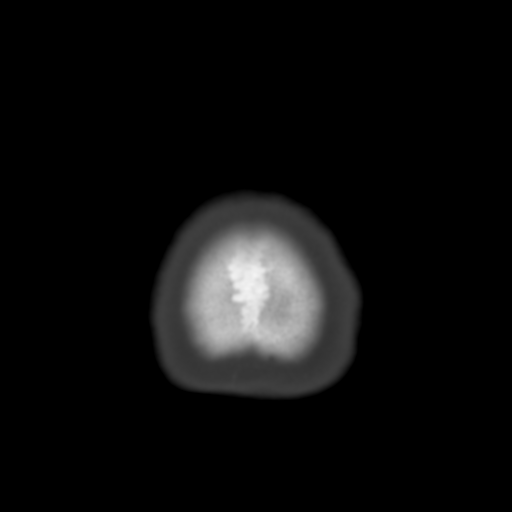

[Series 4: coronal soft tissue · coronal · 0.28mm/px · 3 of 67 slices shown]
[im 23/67  brain]
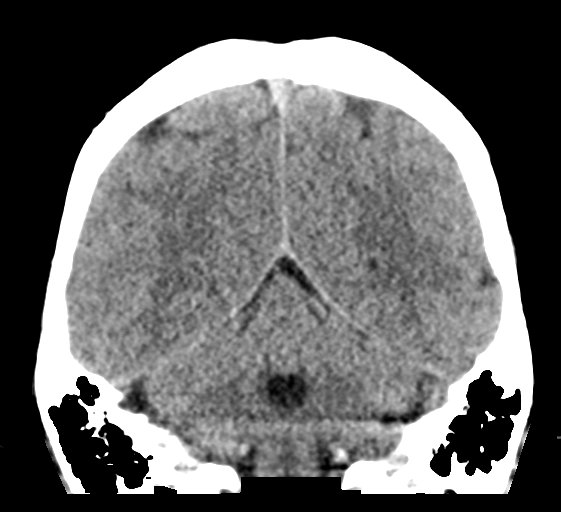
[im 30/67  brain]
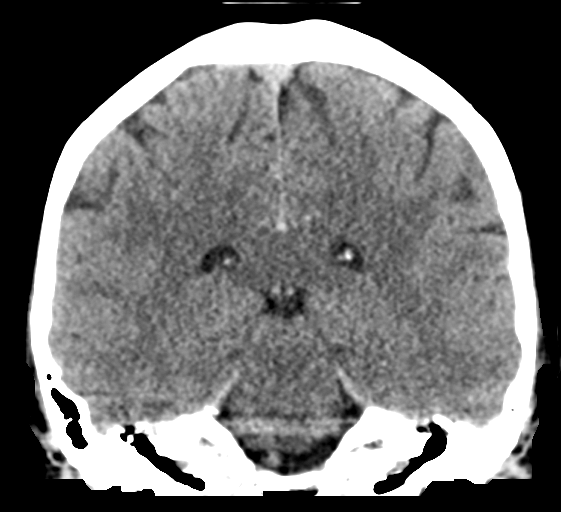
[im 37/67  brain]
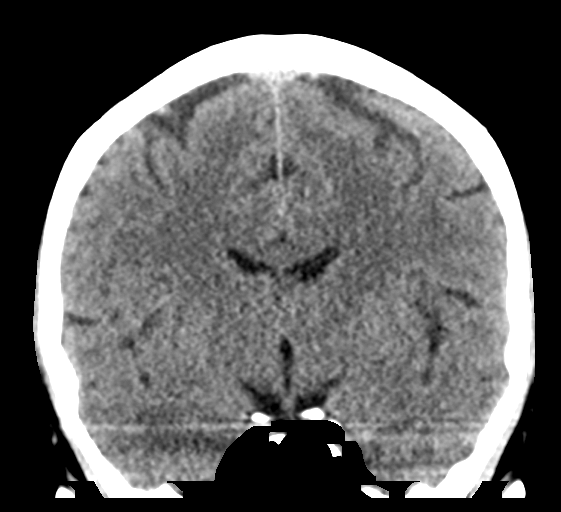

[Series 5: sagittal soft tissue · sagittal · 0.29mm/px · 3 of 67 slices shown]
[im 23/67  brain]
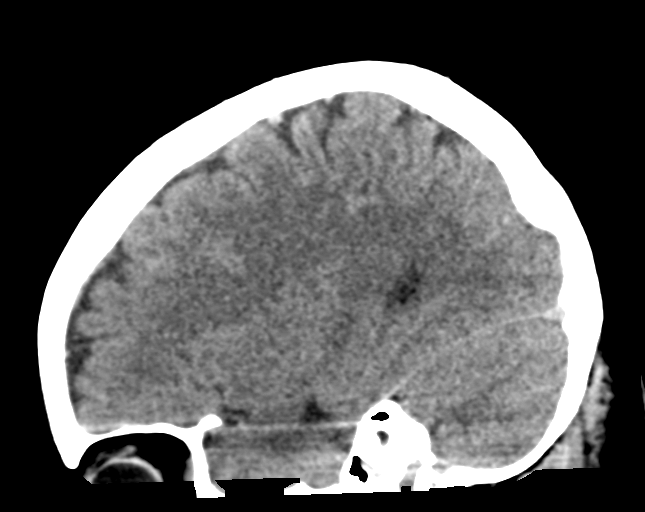
[im 34/67  brain]
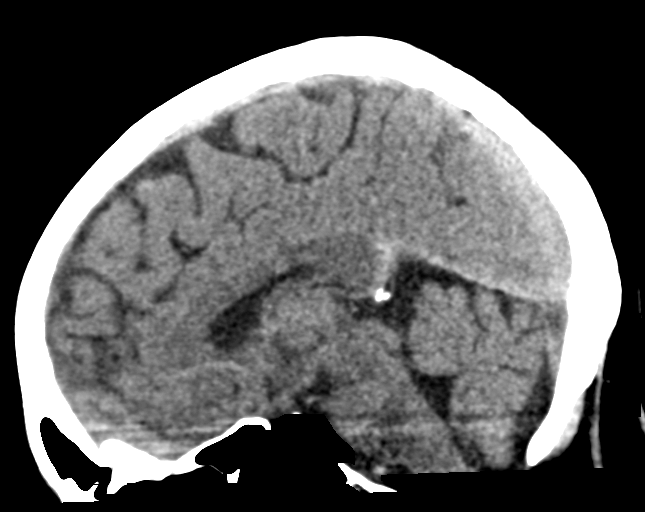
[im 45/67  brain]
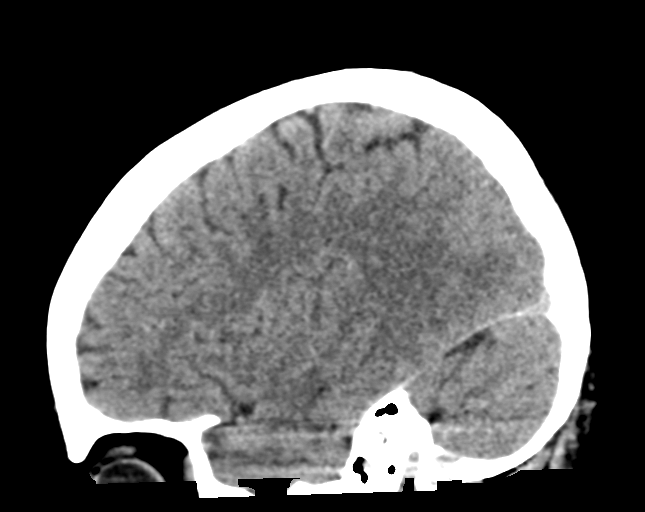

[15 of 46 positions shown; findings below may reference images not displayed]

FINDINGS: Brain:

No evidence of acute intracranial hemorrhage.

No demarcated cortical infarction.

No evidence of intracranial mass.

No midline shift or extra-axial fluid collection.

Cerebral volume is normal for age.

Vascular: No hyperdense vessel.

Skull: Normal. Negative for fracture or focal lesion.

Sinuses/Orbits: Visualized orbits demonstrate no acute abnormality.
ild mucosal thickening within posterior right ethmoid air cells. No
significant mastoid effusion.
IMPRESSION: No CT evidence of acute intracranial abnormality.

## 2021-04-30 ENCOUNTER — Encounter: Payer: Self-pay | Admitting: Podiatry

## 2021-04-30 ENCOUNTER — Ambulatory Visit (INDEPENDENT_AMBULATORY_CARE_PROVIDER_SITE_OTHER): Payer: PRIVATE HEALTH INSURANCE | Admitting: Podiatry

## 2021-04-30 ENCOUNTER — Other Ambulatory Visit: Payer: Self-pay

## 2021-04-30 DIAGNOSIS — L603 Nail dystrophy: Secondary | ICD-10-CM | POA: Diagnosis not present

## 2021-04-30 NOTE — Progress Notes (Signed)
  Subjective:  Patient ID: Katie Sanchez, female    DOB: Apr 17, 1966,  MRN: [id number] HPI Chief Complaint  Patient presents with   Nail Problem    Toenails - thick and discolored x years, treated with lamisil 3 years ago (got better), hallux nail right broke apart last night   New Patient (Initial Visit)    55 y.o. female presents with the above complaint.   ROS: Denies fever chills nausea vomiting muscle aches pains calf pain back pain chest pain shortness of breath.  Past Medical History:  Diagnosis Date   Diverticulitis    Past Surgical History:  Procedure Laterality Date   RADICAL HYSTERECTOMY     RHINOPLASTY     TONSILLECTOMY      Current Outpatient Medications:    L-Methylfolate-Algae (DEPLIN 15 PO), Take by mouth., Disp: , Rfl:    baclofen (LIORESAL) 10 MG tablet, Take by mouth., Disp: , Rfl:    cetirizine (ZYRTEC) 10 MG tablet, Take by mouth., Disp: , Rfl:    desvenlafaxine (PRISTIQ) 50 MG 24 hr tablet, 50 mg in the morning and at bedtime., Disp: , Rfl:    Dexmethylphenidate HCl 30 MG CP24, Take by mouth., Disp: , Rfl:    gabapentin (NEURONTIN) 100 MG capsule, Take by mouth., Disp: , Rfl:    Lactobacillus-Inulin (CULTURELLE DIGESTIVE HEALTH) CAPS, Take by mouth., Disp: , Rfl:    simvastatin (ZOCOR) 20 MG tablet, Take 20 mg by mouth at bedtime., Disp: , Rfl:    valACYclovir (VALTREX) 1000 MG tablet, Take by mouth., Disp: , Rfl:    vitamin B-12 (CYANOCOBALAMIN) 1000 MCG tablet, Take by mouth., Disp: , Rfl:   Allergies  Allergen Reactions   Augmentin [Amoxicillin-Pot Clavulanate]    Review of Systems Objective:  There were no vitals filed for this visit.  General: Well developed, nourished, in no acute distress, alert and oriented x3   Dermatological: Skin is warm, dry and supple bilateral. Nails x 10 are well maintained; remaining integument appears unremarkable at this time. There are no open sores, no preulcerative lesions, no rash or signs of  infection present.  Nail dystrophy to the hallux nail right and second nails bilateral.  Vascular: Dorsalis Pedis artery and Posterior Tibial artery pedal pulses are 2/4 bilateral with immedate capillary fill time. Pedal hair growth present. No varicosities and no lower extremity edema present bilateral.   Neruologic: Grossly intact via light touch bilateral. Vibratory intact via tuning fork bilateral. Protective threshold with Semmes Wienstein monofilament intact to all pedal sites bilateral. Patellar and Achilles deep tendon reflexes 2+ bilateral. No Babinski or clonus noted bilateral.   Musculoskeletal: No gross boney pedal deformities bilateral. No pain, crepitus, or limitation noted with foot and ankle range of motion bilateral. Muscular strength 5/5 in all groups tested bilateral.  Mild hallux interphalangeal with no pain on range of motion of the interphalangeal joint.  Gait: Unassisted, Nonantalgic.    Radiographs:  None taken  Assessment & Plan:   Assessment: Nail dystrophy bilateral mild hallux interphalangeal.  Plan: Discussed etiology pathology conservative surgical therapies at this point I took samples of the nails to be sent for pathologic evaluation.  She also retrieve samples of the nail from her house that she had cut from the hallux right yesterday.  I will follow-up with her in 1 month to discuss findings.     Katie Sanchez, [place]

## 2021-05-21 ENCOUNTER — Other Ambulatory Visit: Payer: Self-pay | Admitting: Family Medicine

## 2021-05-21 DIAGNOSIS — Z1231 Encounter for screening mammogram for malignant neoplasm of breast: Secondary | ICD-10-CM

## 2021-05-30 ENCOUNTER — Ambulatory Visit (INDEPENDENT_AMBULATORY_CARE_PROVIDER_SITE_OTHER): Payer: PRIVATE HEALTH INSURANCE | Admitting: Podiatry

## 2021-05-30 ENCOUNTER — Other Ambulatory Visit: Payer: Self-pay

## 2021-05-30 ENCOUNTER — Encounter: Payer: Self-pay | Admitting: Podiatry

## 2021-05-30 DIAGNOSIS — L603 Nail dystrophy: Secondary | ICD-10-CM | POA: Diagnosis not present

## 2021-05-30 MED ORDER — TERBINAFINE HCL 250 MG PO TABS
250.0000 mg | ORAL_TABLET | Freq: Every day | ORAL | 0 refills | Status: DC
Start: 2021-05-30 — End: 2021-07-04

## 2021-05-30 NOTE — Progress Notes (Signed)
She presents today for follow-up of her nail results.  Objective: Pathology does demonstrate saprophytic fungus.  Assessment: Pain in limb secondary to saprophytic fungus.  Plan: I will get her started on terbinafine and she would like to also consider laser therapy with the [facility] office as well.  Recently she has performed a complete metabolic panel which was completely normal.  So at this point we will start her on Lamisil 250 mg tablets.  She will take 1 tablet daily.  30 would be dispensed.  I will follow-up with her in 1 month.

## 2021-06-04 ENCOUNTER — Other Ambulatory Visit: Payer: Self-pay

## 2021-06-04 ENCOUNTER — Ambulatory Visit: Payer: Self-pay

## 2021-06-04 DIAGNOSIS — L603 Nail dystrophy: Secondary | ICD-10-CM

## 2021-06-04 DIAGNOSIS — B351 Tinea unguium: Secondary | ICD-10-CM

## 2021-06-04 NOTE — Patient Instructions (Signed)

## 2021-06-04 NOTE — Progress Notes (Signed)
Patient presents today for the 1st laser treatment. Diagnosed with mycotic nail infection by Dr. Al Corpus.   Toenail most affected bilateral 1st and 2nd.  All other systems are negative.  Nails were filed thin. Laser therapy was administered to bilateral 1-5 toenails  and patient tolerated the treatment well. All safety precautions were in place.    Follow up in 4 weeks for laser # 2.

## 2021-06-29 ENCOUNTER — Other Ambulatory Visit: Payer: PRIVATE HEALTH INSURANCE

## 2021-07-04 ENCOUNTER — Ambulatory Visit (INDEPENDENT_AMBULATORY_CARE_PROVIDER_SITE_OTHER): Payer: BC Managed Care – PPO | Admitting: Podiatry

## 2021-07-04 ENCOUNTER — Other Ambulatory Visit: Payer: Self-pay

## 2021-07-04 ENCOUNTER — Encounter: Payer: Self-pay | Admitting: Podiatry

## 2021-07-04 DIAGNOSIS — Z79899 Other long term (current) drug therapy: Secondary | ICD-10-CM | POA: Diagnosis not present

## 2021-07-04 DIAGNOSIS — L603 Nail dystrophy: Secondary | ICD-10-CM | POA: Diagnosis not present

## 2021-07-04 MED ORDER — TERBINAFINE HCL 250 MG PO TABS: 250.0000 mg | ORAL_TABLET | Freq: Every day | ORAL | 0 refills | Status: DC

## 2021-07-04 NOTE — Progress Notes (Signed)
She presents today for follow-up of her fungus has completed 30 days of Lamisil and has had 1 laser treatment.  She denies fever chills nausea vomiting muscle aches pains calf pain back pain chest pain shortness of breath and rashes.  Objective: No change in physical exam.  Assessment: Onychomycosis long-term therapy with laser and Lamisil.  Plan: Blood work requisition for complete metabolic panel was issued today and a prescription for Lamisil 250 mg tablets #90 dispensed today she is scheduled for her second laser therapy this week and I will follow-up with her in 3 months

## 2021-07-05 ENCOUNTER — Telehealth: Payer: Self-pay | Admitting: *Deleted

## 2021-07-05 LAB — COMPREHENSIVE METABOLIC PANEL
ALT: 13 IU/L (ref 0–32)
AST: 19 IU/L (ref 0–40)
Albumin/Globulin Ratio: 2 (ref 1.2–2.2)
Albumin: 4.6 g/dL (ref 3.8–4.9)
Alkaline Phosphatase: 100 IU/L (ref 44–121)
BUN/Creatinine Ratio: 11 (ref 9–23)
BUN: 11 mg/dL (ref 6–24)
Bilirubin Total: 0.5 mg/dL (ref 0.0–1.2)
CO2: 27 mmol/L (ref 20–29)
Calcium: 9.8 mg/dL (ref 8.7–10.2)
Chloride: 103 mmol/L (ref 96–106)
Creatinine, Ser: 0.96 mg/dL (ref 0.57–1.00)
Globulin, Total: 2.3 g/dL (ref 1.5–4.5)
Glucose: 91 mg/dL (ref 70–99)
Potassium: 4.4 mmol/L (ref 3.5–5.2)
Sodium: 143 mmol/L (ref 134–144)
Total Protein: 6.9 g/dL (ref 6.0–8.5)
eGFR: 69 mL/min/{1.73_m2} (ref >59)

## 2021-07-05 NOTE — Telephone Encounter (Signed)
-----   Message from Elinor Parkinson, [place] sent at 07/05/2021  7:17 AM EST ----- Blood work looks good and may continue current medications.

## 2021-07-06 ENCOUNTER — Other Ambulatory Visit: Payer: Self-pay

## 2021-07-06 ENCOUNTER — Ambulatory Visit (INDEPENDENT_AMBULATORY_CARE_PROVIDER_SITE_OTHER): Payer: PRIVATE HEALTH INSURANCE

## 2021-07-06 ENCOUNTER — Other Ambulatory Visit: Payer: PRIVATE HEALTH INSURANCE

## 2021-07-06 DIAGNOSIS — L603 Nail dystrophy: Secondary | ICD-10-CM

## 2021-07-06 MED ORDER — TERBINAFINE HCL 250 MG PO TABS: 250.0000 mg | ORAL_TABLET | Freq: Every day | ORAL | 0 refills | Status: DC

## 2021-07-06 NOTE — Progress Notes (Signed)
Patient presents today for the 2nd laser treatment. Diagnosed with mycotic nail infection by Dr. Al Corpus.   Toenail most affected bilateral 1st and 2nd.  All other systems are negative.  Nails were filed thin. Laser therapy was administered to bilateral 1-5 toenails  and patient tolerated the treatment well. All safety precautions were in place.    Follow up in 4 weeks for laser # 3

## 2021-07-06 NOTE — Addendum Note (Signed)
Addended by: Lottie Rater E on: 07/06/2021 11:46 AM   Modules accepted: Orders

## 2021-07-06 NOTE — Addendum Note (Signed)
Addended by: Lottie Rater E on: 07/06/2021 11:44 AM   Modules accepted: Orders

## 2021-07-06 NOTE — Telephone Encounter (Signed)
Sent terbinafine Rx #90 to [facility] [address] and for patient to use Good Rx coupon that I sent to her.

## 2021-07-24 ENCOUNTER — Ambulatory Visit
Admission: RE | Admit: 2021-07-24 | Discharge: 2021-07-24 | Disposition: A | Discharge status: Home / Self Care | Payer: BC Managed Care – PPO | Source: Ambulatory Visit | Attending: Family Medicine | Admitting: Family Medicine

## 2021-07-24 ENCOUNTER — Other Ambulatory Visit: Payer: Self-pay

## 2021-07-24 DIAGNOSIS — Z1231 Encounter for screening mammogram for malignant neoplasm of breast: Secondary | ICD-10-CM | POA: Insufficient documentation

## 2021-07-31 ENCOUNTER — Other Ambulatory Visit: Payer: Self-pay | Admitting: Family Medicine

## 2021-07-31 DIAGNOSIS — N6320 Unspecified lump in the left breast, unspecified quadrant: Secondary | ICD-10-CM

## 2021-07-31 DIAGNOSIS — R928 Other abnormal and inconclusive findings on diagnostic imaging of breast: Secondary | ICD-10-CM

## 2021-08-03 ENCOUNTER — Ambulatory Visit (INDEPENDENT_AMBULATORY_CARE_PROVIDER_SITE_OTHER): Payer: BC Managed Care – PPO

## 2021-08-03 ENCOUNTER — Other Ambulatory Visit: Payer: Self-pay

## 2021-08-03 DIAGNOSIS — L603 Nail dystrophy: Secondary | ICD-10-CM

## 2021-08-03 DIAGNOSIS — B351 Tinea unguium: Secondary | ICD-10-CM

## 2021-08-03 NOTE — Progress Notes (Signed)
Patient presents today for the 3rd laser treatment. Diagnosed with mycotic nail infection by Dr. Al Corpus.   Toenail most affected bilateral 1st and 2nd.  All other systems are negative.  Nails were filed thin. Laser therapy was administered to bilateral 1-5 toenails  and patient tolerated the treatment well. All safety precautions were in place.    Follow up in 6 weeks for laser # 4

## 2021-08-15 ENCOUNTER — Inpatient Hospital Stay: Admission: RE | Admit: 2021-08-15 | Payer: BC Managed Care – PPO | Source: Ambulatory Visit

## 2021-08-15 ENCOUNTER — Ambulatory Visit: Admission: RE | Admit: 2021-08-15 | Payer: BC Managed Care – PPO | Source: Ambulatory Visit

## 2021-09-11 ENCOUNTER — Ambulatory Visit
Admission: RE | Admit: 2021-09-11 | Discharge: 2021-09-11 | Disposition: A | Discharge status: Home / Self Care | Payer: BC Managed Care – PPO | Source: Ambulatory Visit | Attending: Family Medicine | Admitting: Family Medicine

## 2021-09-11 ENCOUNTER — Other Ambulatory Visit: Payer: Self-pay

## 2021-09-11 DIAGNOSIS — N6320 Unspecified lump in the left breast, unspecified quadrant: Secondary | ICD-10-CM

## 2021-09-11 DIAGNOSIS — R928 Other abnormal and inconclusive findings on diagnostic imaging of breast: Secondary | ICD-10-CM | POA: Diagnosis present

## 2021-09-14 ENCOUNTER — Other Ambulatory Visit: Payer: Self-pay

## 2021-09-14 ENCOUNTER — Ambulatory Visit: Payer: BC Managed Care – PPO

## 2021-09-14 DIAGNOSIS — B351 Tinea unguium: Secondary | ICD-10-CM

## 2021-09-14 DIAGNOSIS — L603 Nail dystrophy: Secondary | ICD-10-CM

## 2021-09-14 NOTE — Progress Notes (Signed)
Patient presents today for the 3rd laser treatment. Diagnosed with mycotic nail infection by Dr. Milinda Pointer.  ? ?Toenail most affected bilateral 1st and 2nd. ? ?All other systems are negative. ? ?Nails were filed thin. Laser therapy was administered to bilateral 1-5 toenails  and patient tolerated the treatment well. All safety precautions were in place.  ? ? ?Follow up in 6 weeks for laser # 5 ? ?  ?

## 2021-10-06 ENCOUNTER — Telehealth: Payer: BC Managed Care – PPO | Admitting: Physician Assistant

## 2021-10-06 DIAGNOSIS — S67193A Crushing injury of left middle finger, initial encounter: Secondary | ICD-10-CM

## 2021-10-06 NOTE — Patient Instructions (Signed)
?  Katie Sanchez, thank you for joining Roney Jaffe, PA-C for today's virtual visit.  While this provider is not your primary care provider (PCP), if your PCP is located in our provider database this encounter information will be shared with them immediately following your visit. ? ?Consent: ?(Patient) Katie Sanchez provided verbal consent for this virtual visit at the beginning of the encounter. ? ?Current Medications: ? ?Current Outpatient Medications:  ?  ALPRAZolam (XANAX) 0.25 MG tablet, Take 0.25 mg by mouth daily as needed., Disp: , Rfl:  ?  amoxicillin (AMOXIL) 875 MG tablet, Take 875 mg by mouth 2 (two) times daily., Disp: , Rfl:  ?  baclofen (LIORESAL) 10 MG tablet, Take by mouth., Disp: , Rfl:  ?  cetirizine (ZYRTEC) 10 MG tablet, Take by mouth., Disp: , Rfl:  ?  desvenlafaxine (PRISTIQ) 50 MG 24 hr tablet, 50 mg in the morning and at bedtime., Disp: , Rfl:  ?  Dexmethylphenidate HCl 30 MG CP24, Take by mouth., Disp: , Rfl:  ?  gabapentin (NEURONTIN) 100 MG capsule, Take by mouth., Disp: , Rfl:  ?  L-Methylfolate-Algae (DEPLIN 15 PO), Take by mouth., Disp: , Rfl:  ?  Lactobacillus-Inulin (CULTURELLE DIGESTIVE HEALTH) CAPS, Take by mouth., Disp: , Rfl:  ?  simvastatin (ZOCOR) 20 MG tablet, Take 20 mg by mouth at bedtime., Disp: , Rfl:  ?  terbinafine (LAMISIL) 250 MG tablet, Take 1 tablet (250 mg total) by mouth daily., Disp: 90 tablet, Rfl: 0 ?  valACYclovir (VALTREX) 1000 MG tablet, Take by mouth., Disp: , Rfl:  ?  vitamin B-12 (CYANOCOBALAMIN) 1000 MCG tablet, Take by mouth., Disp: , Rfl:   ? ?Medications ordered in this encounter:  ?No orders of the defined types were placed in this encounter. ?  ? ?*If you need refills on other medications prior to your next appointment, please contact your pharmacy* ? ?Follow-Up: ?Call back or seek an in-person evaluation if the symptoms worsen or if the condition fails to improve as anticipated. ? ?Other Instructions ?It is very  important that you are seen in person at urgent care or the emergency department for further evaluation. ? ? ?If you have been instructed to have an in-person evaluation today at a local Urgent Care facility, please use the link below. It will take you to a list of all of our available [facility], including address, phone number and hours of operation. Please do not delay care.  ?[facility] ? ?If you or a family member do not have a primary care provider, use the link below to schedule a visit and establish care. When you choose a [facility] primary care physician or advanced practice provider, you gain a long-term partner in health. ?Find a Primary Care Provider ? ?Learn more about [facility] in-office and virtual care options: ?[facility] Get Care Now ? ?

## 2021-10-06 NOTE — Progress Notes (Signed)
?Virtual Visit Consent  ? ?Katie Sanchez, you are scheduled for a virtual visit with a [facility] provider today.   ?  ?Just as with appointments in the office, your consent must be obtained to participate.  Your consent will be active for this visit and any virtual visit you may have with one of our providers in the next 365 days.   ?  ?If you have a MyChart account, a copy of this consent can be sent to you electronically.  All virtual visits are billed to your insurance company just like a traditional visit in the office.   ? ?As this is a virtual visit, video technology does not allow for your provider to perform a traditional examination.  This may limit your provider's ability to fully assess your condition.  If your provider identifies any concerns that need to be evaluated in person or the need to arrange testing (such as labs, EKG, etc.), we will make arrangements to do so.   ?  ?Although advances in technology are sophisticated, we cannot ensure that it will always work on either your end or our end.  If the connection with a video visit is poor, the visit may have to be switched to a telephone visit.  With either a video or telephone visit, we are not always able to ensure that we have a secure connection.    ? ?I need to obtain your verbal consent now.   Are you willing to proceed with your visit today?  ?  ?Katie Sanchez has provided verbal consent on 10/06/2021 for a virtual visit (video or telephone). ?  ?Roney Jaffe, PA-C  ? ?Date: 10/06/2021 5:23 PM ? ? ?Virtual Visit via Video Note  ? ?I, Katie Sanchez, connected with  [name]  ([id number], 1966/03/20) on 10/06/21 at  5:15 PM EDT by a video-enabled telemedicine application and verified that I am speaking with the correct person using two identifiers. ? ?Location: ?Patient: Virtual Visit Location Patient: Home ?Provider: Virtual Visit Location Provider: Home Office ?  ?I discussed the limitations of  evaluation and management by telemedicine and the availability of in person appointments. The patient expressed understanding and agreed to proceed.   ? ?History of Present Illness: ?Katie Sanchez is a 56 y.o. who identifies as a female who was assigned female at birth, and is being seen today for pain on the finger tip of her middle finger on her left hand.  States that last night around 6 PM her finger was smashed in the garage door between 2 panels.  States that she has tried ice and over-the-counter pain relievers without much relief.  States that she feels it is more swollen and harder to move than it was last night. ? ? ?HPI: HPI  ?Problems:  ?Patient Active Problem List  ? Diagnosis Date Noted  ? Excessive and frequent menstruation 11/24/2013  ? Headache 11/24/2013  ? Other and unspecified hyperlipidemia 11/24/2013  ? Other and unspecified ovarian cyst 11/24/2013  ?  ?Allergies:  ?Allergies  ?Allergen Reactions  ? Augmentin [Amoxicillin-Pot Clavulanate]   ? ?Medications:  ?Current Outpatient Medications:  ?  ALPRAZolam (XANAX) 0.25 MG tablet, Take 0.25 mg by mouth daily as needed., Disp: , Rfl:  ?  amoxicillin (AMOXIL) 875 MG tablet, Take 875 mg by mouth 2 (two) times daily., Disp: , Rfl:  ?  baclofen (LIORESAL) 10 MG tablet, Take by mouth., Disp: , Rfl:  ?  cetirizine (ZYRTEC) 10 MG tablet, Take by mouth., Disp: , Rfl:  ?  desvenlafaxine (PRISTIQ) 50 MG 24 hr tablet, 50 mg in the morning and at bedtime., Disp: , Rfl:  ?  Dexmethylphenidate HCl 30 MG CP24, Take by mouth., Disp: , Rfl:  ?  gabapentin (NEURONTIN) 100 MG capsule, Take by mouth., Disp: , Rfl:  ?  L-Methylfolate-Algae (DEPLIN 15 PO), Take by mouth., Disp: , Rfl:  ?  Lactobacillus-Inulin (CULTURELLE DIGESTIVE HEALTH) CAPS, Take by mouth., Disp: , Rfl:  ?  simvastatin (ZOCOR) 20 MG tablet, Take 20 mg by mouth at bedtime., Disp: , Rfl:  ?  terbinafine (LAMISIL) 250 MG tablet, Take 1 tablet (250 mg total) by mouth daily., Disp: 90  tablet, Rfl: 0 ?  valACYclovir (VALTREX) 1000 MG tablet, Take by mouth., Disp: , Rfl:  ?  vitamin B-12 (CYANOCOBALAMIN) 1000 MCG tablet, Take by mouth., Disp: , Rfl:  ? ?Observations/Objective: ?Patient is well-developed, well-nourished in no acute distress.  ?Resting comfortably  at home.  ?Head is normocephalic, atraumatic.  ?No labored breathing.  ?Speech is clear and coherent with logical content.  ?Patient is alert and oriented at baseline.  ? ? ?Assessment and Plan: ?1. Crushing injury of left middle finger, initial encounter ? ?Patient informed that she would need to be seen in person for further evaluation, strongly encouraged presenting to urgent care as soon as possible for imaging.  Patient understands and agrees. ? ?Follow Up Instructions: ?I discussed the assessment and treatment plan with the patient. The patient was provided an opportunity to ask questions and all were answered. The patient agreed with the plan and demonstrated an understanding of the instructions.  A copy of instructions were sent to the patient via MyChart unless otherwise noted below.  ? ? ? ?The patient was advised to call back or seek an in-person evaluation if the symptoms worsen or if the condition fails to improve as anticipated. ? ?Time:  ?I spent 5 minutes with the patient via telehealth technology discussing the above problems/concerns.   ? ?Sahmir Weatherbee S Mayers, PA-C ? ?

## 2021-10-26 ENCOUNTER — Other Ambulatory Visit: Payer: Self-pay

## 2021-11-07 ENCOUNTER — Encounter: Payer: Self-pay | Admitting: Podiatry

## 2021-11-07 ENCOUNTER — Ambulatory Visit (INDEPENDENT_AMBULATORY_CARE_PROVIDER_SITE_OTHER): Payer: BC Managed Care – PPO | Admitting: Podiatry

## 2021-11-07 DIAGNOSIS — L603 Nail dystrophy: Secondary | ICD-10-CM | POA: Diagnosis not present

## 2021-11-07 MED ORDER — TERBINAFINE HCL 250 MG PO TABS: 250.0000 mg | ORAL_TABLET | Freq: Every day | ORAL | 0 refills | Status: DC

## 2021-11-07 NOTE — Progress Notes (Signed)
She presents today for follow-up of her nail fungus and her laser therapy of hallux  States that it seems to be doing better denies any problems taking the medication. ? ?Objective: Vital signs stable tellurian x3 nail fungus has grown out considerably and is looking much better. ? ?Assessment: Improved nail fungus. ? ?Plan: Continue use of Lamisil 1 tablet every other day for the next 2 months follow-up with me in 3 months.  We also are going to continue laser therapy in [place].  She will need to continue this for another 3-4 sessions. ?

## 2021-11-07 NOTE — Patient Instructions (Signed)
Dr. Hyatt has sent over a refill for Lamisil to your pharmacy today. The instructions on your bottle will say "take 1 tablet daily", however, he would like for you to take one pill every other day. He will follow up with you in 3 months to re-evaluate your toenails. 

## 2021-11-08 ENCOUNTER — Encounter: Payer: Self-pay | Admitting: *Deleted

## 2021-11-15 ENCOUNTER — Other Ambulatory Visit: Payer: Self-pay

## 2021-11-16 ENCOUNTER — Ambulatory Visit (INDEPENDENT_AMBULATORY_CARE_PROVIDER_SITE_OTHER): Payer: Self-pay

## 2021-11-16 ENCOUNTER — Other Ambulatory Visit: Payer: Self-pay

## 2021-11-16 DIAGNOSIS — B351 Tinea unguium: Secondary | ICD-10-CM

## 2021-11-16 DIAGNOSIS — L603 Nail dystrophy: Secondary | ICD-10-CM

## 2021-11-16 NOTE — Progress Notes (Signed)
Patient presents today for the 5th laser treatment. Diagnosed with mycotic nail infection by Dr. Milinda Pointer.   Toenail most affected bilateral 1st and 2nd.  All other systems are negative.  Nails were filed thin. Laser therapy was administered to bilateral 1-5 toenails  and patient tolerated the treatment well. All safety precautions were in place.    Follow up in 8 weeks for laser # 6

## 2022-01-11 ENCOUNTER — Ambulatory Visit (INDEPENDENT_AMBULATORY_CARE_PROVIDER_SITE_OTHER): Payer: Self-pay | Admitting: Podiatry

## 2022-01-11 DIAGNOSIS — B351 Tinea unguium: Secondary | ICD-10-CM

## 2022-01-11 DIAGNOSIS — L603 Nail dystrophy: Secondary | ICD-10-CM

## 2022-01-11 NOTE — Progress Notes (Signed)
Patient presents today for the 6th laser treatment. Diagnosed with mycotic nail infection by Dr. Al Corpus.    Toenail most affected bilateral 1st and 2nd.   All other systems are negative.   Nails were filed thin. Laser therapy was administered to bilateral 1-5 toenails  and patient tolerated the treatment well. All safety precautions were in place.      Follow up in 2 weeks with Dr. Al Corpus.

## 2022-02-06 ENCOUNTER — Encounter: Payer: BC Managed Care – PPO | Admitting: Podiatry

## 2022-02-06 ENCOUNTER — Telehealth: Payer: Self-pay | Admitting: Podiatry

## 2022-02-06 NOTE — Telephone Encounter (Signed)
Patient called needing refill on terbinafine (LAMISIL) 250 MG tablet , she has appt next Wednesday

## 2022-02-07 NOTE — Telephone Encounter (Signed)
Called patient and notified that Dr Al Corpus wants to wait for refill until she has follow up appointment.

## 2022-02-13 ENCOUNTER — Encounter: Payer: BC Managed Care – PPO | Admitting: Podiatry

## 2022-02-15 ENCOUNTER — Ambulatory Visit (INDEPENDENT_AMBULATORY_CARE_PROVIDER_SITE_OTHER): Payer: BC Managed Care – PPO | Admitting: Podiatry

## 2022-02-15 DIAGNOSIS — L600 Ingrowing nail: Secondary | ICD-10-CM | POA: Diagnosis not present

## 2022-02-15 DIAGNOSIS — B351 Tinea unguium: Secondary | ICD-10-CM

## 2022-02-15 MED ORDER — GENTAMICIN SULFATE 0.1 % EX CREA: 1.0000 | TOPICAL_CREAM | Freq: Two times a day (BID) | CUTANEOUS | 1 refills | Status: DC

## 2022-02-15 NOTE — Progress Notes (Signed)
   Chief Complaint  Patient presents with   Follow-up    Patient is here for laser follow-up. Dr. Al Corpus patient.    Subjective: Patient presents today for evaluation of pain to the medial border bilateral great toes. Patient is concerned for possible ingrown nail.  It is very sensitive to touch.  She does have a history of partial nail matricectomy's in the past to these areas.    Patient has also been treated and managed for fungal nail infection.  She has been finding significant success with the laser antifungal.  She just recently completed oral Lamisil.  She would also like to discuss further treatment for that patient presents today for further treatment and evaluation.  Past Medical History:  Diagnosis Date   Diverticulitis     Objective:  General: Well developed, nourished, in no acute distress, alert and oriented x3   Dermatology: Skin is warm, dry and supple bilateral.  Lateral border bilateral great toes is tender with evidence of an ingrowing nail. Pain on palpation noted to the border of the nail fold. The remaining nails appear unremarkable at this time. There are no open sores, lesions.  Vascular: DP and PT pulses palpable.  No clinical evidence of vascular compromise  Neruologic: Grossly intact via light touch bilateral.  Musculoskeletal: No pedal deformity noted  Assesement: #1  Current paronychia with ingrowing nail lateral border bilateral great toes #2 fungal nail infection bilateral  Plan of Care:  1. Patient evaluated.  2. Discussed treatment alternatives and plan of care. Explained nail avulsion procedure and post procedure course to patient. 3. Patient opted for permanent partial nail avulsion of the ingrown portion of the nail.  4. Prior to procedure, local anesthesia infiltration utilized using 3 ml of a 50:50 mixture of 2% plain lidocaine and 0.5% plain marcaine in a normal hallux block fashion and a betadine prep performed.  5. Partial permanent nail  avulsion with chemical matrixectomy performed using 3x30sec applications of phenol followed by alcohol flush.  6. Light dressing applied.  Post care instructions provided 7.  Prescription for gentamicin 2% cream  8.  Patient may continue the laser antifungal treatment modalities monthly.  Appointment made 9.  OTC topical Tolcylen antifungal also dispensed at checkout.  Apply daily 10.  For now we are going to hold off on additional oral Lamisil.  11.  Return to clinic 2 weeks for ingrown follow-up  Felecia Shelling, DPM [facility]  Dr. Felecia Shelling, DPM    [address] [place], [place] [address]                Office [phone]  Fax [phone]

## 2022-02-22 ENCOUNTER — Ambulatory Visit (INDEPENDENT_AMBULATORY_CARE_PROVIDER_SITE_OTHER): Payer: BC Managed Care – PPO

## 2022-02-22 DIAGNOSIS — B351 Tinea unguium: Secondary | ICD-10-CM

## 2022-02-22 NOTE — Patient Instructions (Signed)

## 2022-02-22 NOTE — Progress Notes (Signed)
Patient presents today for the 1st (2nd round) laser treatment. Diagnosed with mycotic nail infection by Dr. Al Corpus.    Toenail most affected bilateral 1st and 2nd.   All other systems are negative.   Nails were filed thin. Laser therapy was administered to bilateral 1-5 toenails  and patient tolerated the treatment well. All safety precautions were in place.      Laser treatment #2 in 4 weeks

## 2022-03-18 ENCOUNTER — Ambulatory Visit: Payer: BC Managed Care – PPO | Admitting: Podiatry

## 2022-03-23 ENCOUNTER — Other Ambulatory Visit: Payer: Self-pay | Admitting: Podiatry

## 2022-04-05 ENCOUNTER — Other Ambulatory Visit: Payer: BC Managed Care – PPO

## 2022-04-17 ENCOUNTER — Other Ambulatory Visit: Payer: Self-pay

## 2022-04-18 ENCOUNTER — Ambulatory Visit (INDEPENDENT_AMBULATORY_CARE_PROVIDER_SITE_OTHER): Payer: Self-pay | Admitting: *Deleted

## 2022-04-18 DIAGNOSIS — B351 Tinea unguium: Secondary | ICD-10-CM

## 2022-04-18 NOTE — Progress Notes (Signed)
Patient presents today for the 2nd (2nd round) laser treatment. Diagnosed with mycotic nail infection by Dr. Milinda Pointer.    Toenail most affected bilateral 1st and 2nd. She had ingrown procedures on both hallux nails, lateral borders, and they are healing nicely, but she doesn't like how the nail looks.    All other systems are negative.   Nails were filed thin. Laser therapy was administered to 1st and 2nd toenails bilateral and patient tolerated the treatment well. All safety precautions were in place.      Follow up in 4-6 weeks for laser #3

## 2022-05-18 ENCOUNTER — Telehealth: Payer: BC Managed Care – PPO | Admitting: Urgent Care

## 2022-05-18 ENCOUNTER — Ambulatory Visit: Admit: 2022-05-18 | Discharge status: Against Medical Advice | Payer: BC Managed Care – PPO

## 2022-05-18 DIAGNOSIS — H1131 Conjunctival hemorrhage, right eye: Secondary | ICD-10-CM

## 2022-05-18 MED ORDER — AZELASTINE HCL 0.05 % OP SOLN: 1.0000 [drp] | Freq: Two times a day (BID) | OPHTHALMIC | 0 refills | Status: DC

## 2022-05-18 NOTE — Patient Instructions (Signed)
Katie Sanchez, thank you for joining Maretta Bees, PA for today's virtual visit.  While this provider is not your primary care provider (PCP), if your PCP is located in our provider database this encounter information will be shared with them immediately following your visit.   A [facility] MyChart account gives you access to today's visit and all your visits, tests, and labs performed at [facility] " click here if you don't have a [facility] MyChart account or go to [email or web address]  Consent: (Patient) Katie Sanchez provided verbal consent for this virtual visit at the beginning of the encounter.  Current Medications:  Current Outpatient Medications:    azelastine (OPTIVAR) 0.05 % ophthalmic solution, Place 1 drop into the right eye 2 (two) times daily., Disp: 6 mL, Rfl: 0   ALPRAZolam (XANAX) 0.25 MG tablet, Take 0.25 mg by mouth daily as needed., Disp: , Rfl:    baclofen (LIORESAL) 10 MG tablet, Take by mouth., Disp: , Rfl:    cetirizine (ZYRTEC) 10 MG tablet, Take by mouth., Disp: , Rfl:    desvenlafaxine (PRISTIQ) 50 MG 24 hr tablet, 50 mg in the morning and at bedtime., Disp: , Rfl:    Dexmethylphenidate HCl 30 MG CP24, Take by mouth., Disp: , Rfl:    gabapentin (NEURONTIN) 100 MG capsule, Take by mouth., Disp: , Rfl:    L-Methylfolate-Algae (DEPLIN 15 PO), Take by mouth., Disp: , Rfl:    Lactobacillus-Inulin (CULTURELLE DIGESTIVE HEALTH) CAPS, Take by mouth., Disp: , Rfl:    simvastatin (ZOCOR) 20 MG tablet, Take 20 mg by mouth at bedtime., Disp: , Rfl:    terbinafine (LAMISIL) 250 MG tablet, Take 1 tablet (250 mg total) by mouth daily., Disp: 30 tablet, Rfl: 0   valACYclovir (VALTREX) 1000 MG tablet, Take by mouth., Disp: , Rfl:    vitamin B-12 (CYANOCOBALAMIN) 1000 MCG tablet, Take by mouth., Disp: , Rfl:    Medications ordered in this encounter:  Meds ordered this encounter  Medications   azelastine (OPTIVAR) 0.05 %  ophthalmic solution    Sig: Place 1 drop into the right eye 2 (two) times daily.    Dispense:  6 mL    Refill:  0    Order Specific Question:   Supervising Provider    Answer:   Merrilee Jansky [[id number]]     *If you need refills on other medications prior to your next appointment, please contact your pharmacy*  Follow-Up: Call back or seek an in-person evaluation if the symptoms worsen or if the condition fails to improve as anticipated.  [facility] [phone]  Other Instructions You appear to have a subconjunctival hemorrhage, which is a broken blood vessel of the eye. This goes away spontaneously without any treatment. Please read the attached handout/ patient instructions.  However, given your recent exposure to dust, will do trial of topical azelastine eye drops to help with any allergic trigger. You can apply one drop into the right eye up to twice daily on an as needed basis.   If you have been instructed to have an in-person evaluation today at a local Urgent Care facility, please use the link below. It will take you to a list of all of our available [facility], including address, phone number and hours of operation. Please do not delay care.  [facility]  If you or a family member do not have a primary care provider, use the link below  to schedule a visit and establish care. When you choose a [facility] primary care physician or advanced practice provider, you gain a long-term partner in health. Find a Primary Care Provider  Learn more about [facility] in-office and virtual care options: [facility]

## 2022-05-18 NOTE — Progress Notes (Signed)
Virtual Visit Consent   Katie Sanchez, you are scheduled for a virtual visit with a [facility] provider today. Just as with appointments in the office, your consent must be obtained to participate. Your consent will be active for this visit and any virtual visit you may have with one of our providers in the next 365 days. If you have a MyChart account, a copy of this consent can be sent to you electronically.  As this is a virtual visit, video technology does not allow for your provider to perform a traditional examination. This may limit your provider's ability to fully assess your condition. If your provider identifies any concerns that need to be evaluated in person or the need to arrange testing (such as labs, EKG, etc.), we will make arrangements to do so. Although advances in technology are sophisticated, we cannot ensure that it will always work on either your end or our end. If the connection with a video visit is poor, the visit may have to be switched to a telephone visit. With either a video or telephone visit, we are not always able to ensure that we have a secure connection.  By engaging in this virtual visit, you consent to the provision of healthcare and authorize for your insurance to be billed (if applicable) for the services provided during this visit. Depending on your insurance coverage, you may receive a charge related to this service.  I need to obtain your verbal consent now. Are you willing to proceed with your visit today? Katie Sanchez has provided verbal consent on 05/18/2022 for a virtual visit (video or telephone). Maretta Bees, PA  Date: 05/18/2022 2:47 PM  Virtual Visit via Video Note   I, Katie Sanchez L Versa Craton, connected with  [address] Earl Zellmer  ([id number], [birth year]) on 05/18/22 at  2:45 PM EST by a video-enabled telemedicine application and verified that I am speaking with the correct person using two  identifiers.  Location: Patient: Virtual Visit Location Patient: Home Provider: Virtual Visit Location Provider: Home Office   I discussed the limitations of evaluation and management by telemedicine and the availability of in person appointments. The patient expressed understanding and agreed to proceed.    History of Present Illness: Katie Sanchez is a 57 y.o. who identifies as a female who was assigned female at birth, and is being seen today for red medial R eye.  HPI: Pleasant 56yo female presents today with concern of what looks like blood to the medial aspect of her R eye. Denies any trauma to her eye. Denies hx of HTN, is not on blood thinning medications, and has not had any recent episodes of forceful vomiting, sneezing or coughing. Woke up this morning with it. Denies eye pain, discharge, change in vision. No fever. Started after going into the dusty basement when she felt a scratchy throat and mild ocular irritation, but no other known cause. No tx tried as she woke up with it this morning.   Problems:  Patient Active Problem List   Diagnosis Date Noted   Excessive and frequent menstruation 11/24/2013   Headache 11/24/2013   Other and unspecified hyperlipidemia 11/24/2013   Other and unspecified ovarian cyst 11/24/2013    Allergies:  Allergies  Allergen Reactions   Augmentin [Amoxicillin-Pot Clavulanate]    Medications:  Current Outpatient Medications:    azelastine (OPTIVAR) 0.05 % ophthalmic solution, Place 1 drop into the right eye 2 (two) times daily., Disp: 6 mL, Rfl:  0   ALPRAZolam (XANAX) 0.25 MG tablet, Take 0.25 mg by mouth daily as needed., Disp: , Rfl:    baclofen (LIORESAL) 10 MG tablet, Take by mouth., Disp: , Rfl:    cetirizine (ZYRTEC) 10 MG tablet, Take by mouth., Disp: , Rfl:    desvenlafaxine (PRISTIQ) 50 MG 24 hr tablet, 50 mg in the morning and at bedtime., Disp: , Rfl:    Dexmethylphenidate HCl 30 MG CP24, Take by mouth., Disp: , Rfl:     gabapentin (NEURONTIN) 100 MG capsule, Take by mouth., Disp: , Rfl:    L-Methylfolate-Algae (DEPLIN 15 PO), Take by mouth., Disp: , Rfl:    Lactobacillus-Inulin (CULTURELLE DIGESTIVE HEALTH) CAPS, Take by mouth., Disp: , Rfl:    simvastatin (ZOCOR) 20 MG tablet, Take 20 mg by mouth at bedtime., Disp: , Rfl:    terbinafine (LAMISIL) 250 MG tablet, Take 1 tablet (250 mg total) by mouth daily., Disp: 30 tablet, Rfl: 0   valACYclovir (VALTREX) 1000 MG tablet, Take by mouth., Disp: , Rfl:    vitamin B-12 (CYANOCOBALAMIN) 1000 MCG tablet, Take by mouth., Disp: , Rfl:   Observations/Objective: Patient is well-developed, well-nourished in no acute distress.  Resting comfortably in drivers seat of parked car.  Head is normocephalic, atraumatic.  No labored breathing. No cough. Speech is clear and coherent with logical content.  Patient is alert and oriented at baseline.  R medial sclera shows a subconjunctival hemorrhage. Iris and pupil appear normal. No discharge or drainage from eye. No ocular swelling, normal appearing lids. EOMI.  Assessment and Plan: 1. Subconjunctival hemorrhage of right eye  Pt's sx appear most consistent with a subconjunctival hemorrhage both in appearance and symptomatology. Supportive measures appear appropriate, however given her exposure to the dust and other resolved irritant allergen symptoms, will do trial of topical azelastine eye drops. Pt to take on a PRN basis only.  Follow Up Instructions: I discussed the assessment and treatment plan with the patient. The patient was provided an opportunity to ask questions and all were answered. The patient agreed with the plan and demonstrated an understanding of the instructions.  A copy of instructions were sent to the patient via MyChart unless otherwise noted below.    The patient was advised to call back or seek an in-person evaluation if the symptoms worsen or if the condition fails to improve as  anticipated.  Time:  I spent 7 minutes with the patient via telehealth technology discussing the above problems/concerns.    Katie Reh L Khayri Kargbo, PA

## 2022-06-21 ENCOUNTER — Telehealth: Payer: Self-pay

## 2022-06-23 NOTE — Telephone Encounter (Signed)
Looks like she has been doing laser and hasn't had a refill of lamisil since May. Will definitely need appt if she wants to continue laser

## 2022-06-27 NOTE — Telephone Encounter (Signed)
Patient has been updated ,will call back if she decides to schedule an appointment, said that the appointments are very expensive.

## 2022-10-15 ENCOUNTER — Telehealth: Payer: Self-pay | Admitting: Podiatry

## 2022-10-15 NOTE — Telephone Encounter (Signed)
Patient called to schedule appt for fungus follow up. Patient wanted me to reach out to the RN to ask why she needs to follow up so often for the fungus. Patiuent would like a call back. I did talk with her about the insurance companies auditing for care but she wanted to know why she needs to keep coming in.

## 2022-10-18 ENCOUNTER — Telehealth: Payer: Self-pay | Admitting: *Deleted

## 2022-10-18 NOTE — Telephone Encounter (Signed)
Patient has been updated concerning laser scheduling, will call back on Monday to schedule.

## 2022-11-01 ENCOUNTER — Ambulatory Visit (INDEPENDENT_AMBULATORY_CARE_PROVIDER_SITE_OTHER): Payer: BC Managed Care – PPO

## 2022-11-01 DIAGNOSIS — B351 Tinea unguium: Secondary | ICD-10-CM

## 2022-11-01 NOTE — Progress Notes (Signed)
Patient presents today for the 2nd (2nd round) laser treatment. Diagnosed with mycotic nail infection by Dr. Hyatt.    Toenail most affected bilateral 1st and 2nd. She had ingrown procedures on both hallux nails, lateral borders, and they are healing nicely, but she doesn't like how the nail looks.    All other systems are negative.   Nails were filed thin. Laser therapy was administered to 1st and 2nd toenails bilateral and patient tolerated the treatment well. All safety precautions were in place.      Follow up in 4-6 weeks for laser #3 

## 2022-11-11 ENCOUNTER — Ambulatory Visit (INDEPENDENT_AMBULATORY_CARE_PROVIDER_SITE_OTHER): Payer: BC Managed Care – PPO | Admitting: Podiatry

## 2022-11-11 ENCOUNTER — Encounter: Payer: Self-pay | Admitting: Podiatry

## 2022-11-11 DIAGNOSIS — B351 Tinea unguium: Secondary | ICD-10-CM | POA: Diagnosis not present

## 2022-11-11 MED ORDER — TERBINAFINE HCL 250 MG PO TABS: 250.0000 mg | ORAL_TABLET | Freq: Every day | ORAL | 0 refills | Status: DC

## 2022-11-12 NOTE — Progress Notes (Signed)
She presents today concerned about the hallux right states that she had several rounds of the laser therapy does not know why it cannot be done more often states that she was told that she had to come back for another prescription for either medication or laser therapy.  She states that the toenail still crumbles as it grows out after certain point as she refers to the right hallux.  Objective: Vital signs are stable she is alert and oriented x 3.  Pulses are palpable.  She does demonstrate some nail dystrophy to the distal medial aspect of the hallux right does appear to be fungal in nature but as the fungal culture and lab demonstrates that does demonstrate a nail dystrophy which is secondary to trauma which could have resulted in the nail not growing out completely.  And that also I did demonstrate a saprophytic fungus which are harder to get rid of.  Assessment: Onychomycosis nail dystrophy hallux right.  Plan: Discussed etiology pathology and surgical therapies at this point we will start her back on Lamisil 250 mg tablets.  She will take 1 tablet daily and I like to follow-up with her in 1 month for lab work if necessary.

## 2022-12-11 ENCOUNTER — Ambulatory Visit: Payer: BC Managed Care – PPO | Admitting: Podiatry

## 2022-12-13 ENCOUNTER — Other Ambulatory Visit: Payer: BC Managed Care – PPO

## 2022-12-17 ENCOUNTER — Other Ambulatory Visit: Payer: Self-pay | Admitting: *Deleted

## 2022-12-17 ENCOUNTER — Other Ambulatory Visit: Payer: Self-pay | Admitting: Podiatry

## 2022-12-17 DIAGNOSIS — L603 Nail dystrophy: Secondary | ICD-10-CM

## 2022-12-17 DIAGNOSIS — Z79899 Other long term (current) drug therapy: Secondary | ICD-10-CM

## 2022-12-17 NOTE — Telephone Encounter (Signed)
Pt called and was asking about her refill on the nail fungus medication. I did explain that Dr Al Corpus is out of the office until tomorrow. She stated she did cxl her appt 6.19.due to out of town for a funeral and was not sure why she needed to come in for an appt. She understands that we needed to get labs done. Could we just send in lab orders and check the labs and call in rx if labs are ok. Pt states she has only been on medication for a month and does not think things with the fungus would change much in a month. Please advise

## 2022-12-17 NOTE — Telephone Encounter (Signed)
Patient is calling for a refill of the lamicil. Does she have to schedule f/u appointment or can she just have the blood work redrawn and continue taking the medicine? please advise.

## 2022-12-18 ENCOUNTER — Other Ambulatory Visit: Payer: Self-pay | Admitting: Podiatry

## 2022-12-18 MED ORDER — TERBINAFINE HCL 250 MG PO TABS: 250.0000 mg | ORAL_TABLET | Freq: Every day | ORAL | 0 refills | Status: DC

## 2022-12-18 NOTE — Telephone Encounter (Signed)
Spoke with patient giving information to have labs re drawn and if they are ok, will continue with the lamisil,verbalized understanding  and will have it done today.

## 2022-12-19 LAB — COMPREHENSIVE METABOLIC PANEL
ALT: 25 IU/L (ref 0–32)
AST: 24 IU/L (ref 0–40)
Albumin: 4.4 g/dL (ref 3.8–4.9)
Alkaline Phosphatase: 95 IU/L (ref 44–121)
BUN/Creatinine Ratio: 17 (ref 9–23)
BUN: 17 mg/dL (ref 6–24)
Bilirubin Total: 0.5 mg/dL (ref 0.0–1.2)
CO2: 26 mmol/L (ref 20–29)
Calcium: 10 mg/dL (ref 8.7–10.2)
Chloride: 101 mmol/L (ref 96–106)
Creatinine, Ser: 0.98 mg/dL (ref 0.57–1.00)
Globulin, Total: 2.4 g/dL (ref 1.5–4.5)
Glucose: 93 mg/dL (ref 70–99)
Potassium: 4.6 mmol/L (ref 3.5–5.2)
Sodium: 141 mmol/L (ref 134–144)
Total Protein: 6.8 g/dL (ref 6.0–8.5)
eGFR: 66 mL/min/{1.73_m2} (ref >59)

## 2022-12-24 ENCOUNTER — Telehealth: Payer: Self-pay | Admitting: *Deleted

## 2022-12-24 NOTE — Telephone Encounter (Signed)
Spoke with the patient giving results from her labs per physician that it looked great and that she may continue w/ medication, verbalized understanding and said she will pick up her medication.

## 2022-12-24 NOTE — Telephone Encounter (Signed)
-----   Message from Elinor Parkinson, [place] sent at 12/23/2022 11:44 AM EDT ----- Blood work looks perfect and may continue medication

## 2023-03-14 ENCOUNTER — Other Ambulatory Visit: Payer: BC Managed Care – PPO

## 2023-05-02 ENCOUNTER — Ambulatory Visit (INDEPENDENT_AMBULATORY_CARE_PROVIDER_SITE_OTHER): Payer: BC Managed Care – PPO | Admitting: Podiatrist

## 2023-05-02 DIAGNOSIS — Z91199 Patient's noncompliance with other medical treatment and regimen due to unspecified reason: Secondary | ICD-10-CM

## 2023-05-02 NOTE — Progress Notes (Signed)
No show for laser appt

## 2023-11-26 DIAGNOSIS — F902 Attention-deficit hyperactivity disorder, combined type: Secondary | ICD-10-CM | POA: Insufficient documentation

## 2023-11-26 DIAGNOSIS — I10 Essential (primary) hypertension: Secondary | ICD-10-CM | POA: Insufficient documentation

## 2023-11-26 DIAGNOSIS — F339 Major depressive disorder, recurrent, unspecified: Secondary | ICD-10-CM | POA: Insufficient documentation

## 2024-06-01 DIAGNOSIS — M899 Disorder of bone, unspecified: Secondary | ICD-10-CM | POA: Insufficient documentation

## 2024-06-01 DIAGNOSIS — R937 Abnormal findings on diagnostic imaging of other parts of musculoskeletal system: Secondary | ICD-10-CM | POA: Insufficient documentation

## 2024-06-01 DIAGNOSIS — Z789 Other specified health status: Secondary | ICD-10-CM | POA: Insufficient documentation

## 2024-06-01 DIAGNOSIS — G894 Chronic pain syndrome: Secondary | ICD-10-CM | POA: Insufficient documentation

## 2024-06-01 DIAGNOSIS — Z79899 Other long term (current) drug therapy: Secondary | ICD-10-CM | POA: Insufficient documentation

## 2024-06-01 NOTE — Progress Notes (Unsigned)
 PROVIDER NOTE: Interpretation of information contained herein should be left to medically-trained personnel. Specific patient instructions are provided elsewhere under Patient Instructions section of medical record. This document was created in part using AI and STT-dictation technology, any transcriptional errors that may result from this process are unintentional.  Patient: Katie Sanchez  Service: E/M Encounter  Provider: Eric DELENA Como, MD  DOB: 13-May-1966  Delivery: Face-to-face  Specialty: Interventional Pain Management  MRN: [id number]  Setting: Ambulatory outpatient facility  Specialty designation: 09  Type: New Patient  Location: Outpatient office facility  PCP: Valora Lynwood FALCON, MD  DOS: 06/02/2024    Referring Prov.: Maree Jannett POUR, MD   Primary Reason(s) for Visit: Encounter for initial evaluation of one or more chronic problems (new to examiner) potentially causing chronic pain, and posing a threat to normal musculoskeletal function. (Level of risk: High) CC: No chief complaint on file.  HPI  Katie Sanchez is a 58 y.o. year old, female patient, who comes for the first time to our practice referred by Maree Jannett POUR, MD for our initial evaluation of her chronic pain. She has Excessive and frequent menstruation; Headache; Other and unspecified hyperlipidemia; Other and unspecified ovarian cyst; Attention deficit hyperactivity disorder (ADHD), combined type; Essential hypertension; Recurrent major depressive disorder; Chronic pain syndrome; Pharmacologic therapy; Disorder of skeletal system; Problems influencing health status; and Abnormal MRI, cervical spine (06/05/2019) on their problem list. Today she comes in for evaluation of her No chief complaint on file.  Pain Assessment: Location:     Radiating:   Onset:   Duration:   Quality:   Severity:  /10 (subjective, self-reported pain score)  Effect on ADL:   Timing:   Modifying factors:   BP:    HR:     Onset and Duration: {Hx; Onset and Duration:210120511} Cause of pain: {Hx; Cause:210120521} Severity: {Pain Severity:210120502} Timing: {Symptoms; Timing:210120501} Aggravating Factors: {Causes; Aggravating pain factors:210120507} Alleviating Factors: {Causes; Alleviating Factors:210120500} Associated Problems: {Hx; Associated problems:210120515} Quality of Pain: {Hx; Symptom quality or Descriptor:210120531} Previous Examinations or Tests: {Hx; Previous examinations or test:210120529} Previous Treatments: {Hx; Previous Treatment:210120503}  Katie Sanchez is being evaluated for possible interventional pain management therapies for the treatment of her chronic pain.  Discussed the use of AI scribe software for clinical note transcription with the patient, who gave verbal consent to proceed.  History of Present Illness            Katie Sanchez has been informed that this initial visit was an evaluation only.  On the follow up appointment I will go over the results, including ordered tests and available interventional therapies. At that time she will have the opportunity to decide whether to proceed with offered therapies or not. In the event that Ms. Chayna Surratt prefers avoiding interventional options, this will conclude our involvement in the case.  Medication management recommendations may be provided upon request.  Patient informed that diagnostic tests may be ordered to assist in identifying underlying causes, narrow the list of differential diagnoses and aid in determining candidacy for (or contraindications to) planned therapeutic interventions.  Historic Controlled Substance Pharmacotherapy Review PMP and historical list of controlled substances: Dexmethylphenidate Er 40 Mg Cp, 1 capsule daily (# 90) (last filled 05/06/2024); gabapentin 300 mg capsule, 1 cap p.o. twice daily (60/month) (last filled on 05/04/2024); gabapentin 100 mg capsule, 1 cap p.o. daily (30/month)  (last filled on 04/22/2024) Most recently prescribed controlled substance(s): Opioid Analgesic: None MME/day: 0 mg/day  Historical Monitoring: The  patient  reports no history of drug use. List of prior UDS Testing: No results found for: MDMA, COCAINSCRNUR, PCPSCRNUR, PCPQUANT, CANNABQUANT, THCU, ETH, CBDTHCR, D8THCCBX, D9THCCBX Historical Background Evaluation: [place] PMP: PDMP reviewed during this encounter. Review of the past 33-months conducted.             PMP NARX Score Report:  Narcotic: 000 Sedative: 000 Stimulant: [address] Department of public safety, offender search: Engineer, [employer]) Non-contributory Risk Assessment Profile: Aberrant behavior: None observed or detected today Risk factors for fatal opioid overdose: None identified today PMP NARX Overdose Risk Score: 250 Fatal overdose hazard ratio (HR): Calculation deferred Non-fatal overdose hazard ratio (HR): Calculation deferred Risk of opioid abuse or dependence: 0.7-3.0% with doses <= 36 MME/day and 6.1-26% with doses >= 120 MME/day. Substance use disorder (SUD) risk level: See below Personal History of Substance Abuse (SUD-Substance use disorder):  Alcohol:    Illegal Drugs:    Rx Drugs:    ORT Risk Level calculation:    ORT Scoring interpretation table:  Score <3 = Low Risk for SUD  Score between 4-7 = Moderate Risk for SUD  Score >8 = High Risk for Opioid Abuse   PHQ-2 Depression Scale:  Total score:    PHQ-2 Scoring interpretation table: (Score and probability of major depressive disorder)  Score 0 = No depression  Score 1 = 15.4% Probability  Score 2 = 21.1% Probability  Score 3 = 38.4% Probability  Score 4 = 45.5% Probability  Score 5 = 56.4% Probability  Score 6 = 78.6% Probability   PHQ-9 Depression Scale:  Total score:    PHQ-9 Scoring interpretation table:  Score 0-4 = No depression  Score 5-9 = Mild depression  Score 10-14 = Moderate depression  Score 15-19 = Moderately  severe depression  Score 20-27 = Severe depression (2.4 times higher risk of SUD and 2.89 times higher risk of overuse)   Pharmacologic Plan: As per protocol, I have not taken over any controlled substance management, pending the results of ordered tests and/or consults.            Initial impression: Pending review of available data and ordered tests.  Meds   Current Outpatient Medications:    ALPRAZolam (XANAX) 0.25 MG tablet, Take 0.25 mg by mouth daily as needed., Disp: , Rfl:    azelastine  (OPTIVAR ) 0.05 % ophthalmic solution, Place 1 drop into the right eye 2 (two) times daily., Disp: 6 mL, Rfl: 0   baclofen (LIORESAL) 10 MG tablet, Take by mouth., Disp: , Rfl:    cetirizine (ZYRTEC) 10 MG tablet, Take by mouth., Disp: , Rfl:    Dexmethylphenidate HCl 30 MG CP24, Take by mouth., Disp: , Rfl:    gabapentin (NEURONTIN) 100 MG capsule, Take by mouth., Disp: , Rfl:    L-Methylfolate-Algae (DEPLIN 15 PO), Take by mouth., Disp: , Rfl:    Lactobacillus-Inulin (CULTURELLE DIGESTIVE HEALTH) CAPS, Take by mouth., Disp: , Rfl:    simvastatin (ZOCOR) 20 MG tablet, Take 20 mg by mouth at bedtime., Disp: , Rfl:    terbinafine  (LAMISIL ) 250 MG tablet, Take 1 tablet (250 mg total) by mouth daily., Disp: 90 tablet, Rfl: 0   TRINTELLIX 20 MG TABS tablet, Take 20 mg by mouth daily., Disp: , Rfl:    valACYclovir (VALTREX) 1000 MG tablet, Take by mouth., Disp: , Rfl:    vitamin B-12 (CYANOCOBALAMIN) 1000 MCG tablet, Take by mouth., Disp: , Rfl:   Imaging Review  Cervical Imaging: Cervical MR wo contrast:  Results for orders placed during the hospital encounter of 06/04/19 MR Cervical Spine Wo Contrast  Narrative CLINICAL DATA:  Left facial and left arm pain  EXAM: MRI CERVICAL SPINE WITHOUT CONTRAST  TECHNIQUE: Multiplanar, multisequence MR imaging of the cervical spine was performed. No intravenous contrast was administered.  COMPARISON:  11/19/2016 cervical spine MRI  FINDINGS: Alignment:  Physiologic.  Vertebrae: C5-7 ACDF. No acute compression fracture, discitis-osteomyelitis, facet edema or other focal marrow lesion. No epidural collection.  Cord: Normal  Posterior Fossa, vertebral arteries, paraspinal tissues: Negative  Disc levels:  C1-C2: Normal.  C2-C3: Moderate right facet hypertrophy, unchanged. Mild right foraminal stenosis. No spinal canal stenosis.  C3-C4: Normal disc space and facets. No spinal canal or neuroforaminal stenosis.  C4-C5: Small left subarticular disc protrusion, unchanged. No spinal canal stenosis. The ventral thecal sac is effaced. No neural foraminal stenosis.  C5-C6: ACDF without spinal canal or neural foraminal stenosis.  C6-C7: ACDF without spinal canal or neural foraminal stenosis.  C7-T1: Normal disc space and facets. No spinal canal or neuroforaminal stenosis.  IMPRESSION: 1. C5-7 ACDF without residual spinal canal or neural foraminal stenosis at the fused levels. 2. Unchanged small left subarticular disc protrusion at C4-C5 narrowing the ventral thecal sac. 3. Unchanged moderate right C2-C3 facet arthrosis with mild right foraminal stenosis.   Electronically Signed By: Franky Stanford M.D. On: 06/05/2019 00:56  Cervical DG 2-3 views: Results for orders placed during the hospital encounter of 01/03/10 DG Cervical Spine 2-3 Views  Narrative Clinical Data: C5-C7 ACDF  CERVICAL SPINE - 2-3 VIEW  Comparison: None  Findings: Initial film shows a needle at the anterior disc space of C5-6.  A second film shows anterior cervical discectomy and fusion from C5-C7.  Interbody fusion material is well positioned.  There is an anterior plate with screw fixation.  Sponges remain in the operative approach.  IMPRESSION: ACDF C5-C7  Provider: Benton Mussel, [place]  Complexity Note: Imaging results reviewed.                         ROS  Cardiovascular: {Hx; Cardiovascular History:210120525} Pulmonary or  Respiratory: {Hx; Pumonary and/or Respiratory History:210120523} Neurological: {Hx; Neurological:210120504} Psychological-Psychiatric: {Hx; Psychological-Psychiatric History:210120512} Gastrointestinal: {Hx; Gastrointestinal:210120527} Genitourinary: {Hx; Genitourinary:210120506} Hematological: {Hx; Hematological:210120510} Endocrine: {Hx; Endocrine history:210120509} Rheumatologic: {Hx; Rheumatological:210120530} Musculoskeletal: {Hx; Musculoskeletal:210120528} Work History: {Hx; Work history:210120514}  Allergies  Ms. Prentiss Hammett is allergic to augmentin [amoxicillin-pot clavulanate].  Laboratory Chemistry Profile   Renal Lab Results  Component Value Date   BUN 17 12/18/2022   CREATININE 0.98 12/18/2022   BCR 17 12/18/2022   GFRAA >60 06/04/2019   GFRNONAA >60 06/04/2019   PROTEINUR NEGATIVE 06/17/2019     Electrolytes Lab Results  Component Value Date   NA 141 12/18/2022   K 4.6 12/18/2022   CL 101 12/18/2022   CALCIUM 10.0 12/18/2022     Hepatic Lab Results  Component Value Date   AST 24 12/18/2022   ALT 25 12/18/2022   ALBUMIN 4.4 12/18/2022   ALKPHOS 95 12/18/2022   LIPASE 28 11/07/2016     ID Lab Results  Component Value Date   SARSCOV2NAA POSITIVE (A) 06/17/2019   STAPHAUREUS (A) 01/03/2010    POSITIVE        The Xpert SA Assay (FDA approved for NASAL specimens only), is one component of a comprehensive surveillance program.  It is not intended to diagnose infection nor to guide or monitor treatment.   MRSAPCR POSITIVE (A) 01/03/2010  Bone No results found for: VD25OH, [id number], [id number], CI7874NY7, 25OHVITD1, 25OHVITD2, 25OHVITD3, TESTOFREE, TESTOSTERONE   Endocrine Lab Results  Component Value Date   GLUCOSE 93 12/18/2022   GLUCOSEU NEGATIVE 06/17/2019     Neuropathy No results found for: VITAMINB12, FOLATE, HGBA1C, HIV   CNS No results found for: COLORCSF, APPEARCSF, RBCCOUNTCSF, WBCCSF,  POLYSCSF, LYMPHSCSF, EOSCSF, PROTEINCSF, GLUCCSF, JCVIRUS, CSFOLI, IGGCSF, LABACHR, ACETBL   Inflammation (CRP: Acute  ESR: Chronic) No results found for: CRP, ESRSEDRATE, LATICACIDVEN   Rheumatology No results found for: RF, ANA, LABURIC, URICUR, LYMEIGGIGMAB, LYMEABIGMQN, HLAB27   Coagulation Lab Results  Component Value Date   INR 0.9 06/04/2019   LABPROT 12.4 06/04/2019   APTT 29 06/04/2019   PLT 190 06/04/2019     Cardiovascular Lab Results  Component Value Date   HGB 14.8 06/04/2019   HCT 44.7 06/04/2019     Screening Lab Results  Component Value Date   SARSCOV2NAA POSITIVE (A) 06/17/2019   STAPHAUREUS (A) 01/03/2010    POSITIVE        The Xpert SA Assay (FDA approved for NASAL specimens only), is one component of a comprehensive surveillance program.  It is not intended to diagnose infection nor to guide or monitor treatment.   MRSAPCR POSITIVE (A) 01/03/2010     Cancer No results found for: CEA, CA125, LABCA2   Allergens No results found for: ALMOND, APPLE, ASPARAGUS, AVOCADO, BANANA, BARLEY, BASIL, BAYLEAF, GREENBEAN, LIMABEAN, WHITEBEAN, BEEFIGE, REDBEET, BLUEBERRY, BROCCOLI, CABBAGE, MELON, CARROT, CASEIN, CASHEWNUT, CAULIFLOWER, CELERY     Note: Lab results reviewed.  PFSH  Drug: Ms. Kyia Rhude  reports no history of drug use. Alcohol:  reports current alcohol use. Tobacco:  reports that she has never smoked. She has never used smokeless tobacco. Medical:  has a past medical history of Diverticulitis. Family: family history includes Breast cancer (age of onset: 57) in her maternal grandmother.  Past Surgical History:  Procedure Laterality Date   RADICAL HYSTERECTOMY     RHINOPLASTY     TONSILLECTOMY     Active Ambulatory Problems    Diagnosis Date Noted   Excessive and frequent menstruation 11/24/2013   Headache 11/24/2013   Other and unspecified  hyperlipidemia 11/24/2013   Other and unspecified ovarian cyst 11/24/2013   Attention deficit hyperactivity disorder (ADHD), combined type 11/26/2023   Essential hypertension 11/26/2023   Recurrent major depressive disorder 11/26/2023   Chronic pain syndrome 06/01/2024   Pharmacologic therapy 06/01/2024   Disorder of skeletal system 06/01/2024   Problems influencing health status 06/01/2024   Abnormal MRI, cervical spine (06/05/2019) 06/01/2024   Resolved Ambulatory Problems    Diagnosis Date Noted   No Resolved Ambulatory Problems   Past Medical History:  Diagnosis Date   Diverticulitis    Constitutional Exam  General appearance: Well nourished, well developed, and well hydrated. In no apparent acute distress There were no vitals filed for this visit. BMI Assessment: Estimated body mass index is 29.18 kg/m as calculated from the following:   Height as of 06/17/19: 5' 4 (1.626 m).   Weight as of 06/17/19: 170 lb (77.1 kg).  BMI interpretation table: BMI level Category Range association with higher incidence of chronic pain  <18 kg/m2 Underweight   18.5-24.9 kg/m2 Ideal body weight   25-29.9 kg/m2 Overweight Increased incidence by 20%  30-34.9 kg/m2 Obese (Class I) Increased incidence by 68%  35-39.9 kg/m2 Severe obesity (Class II) Increased incidence by 136%  >40 kg/m2 Extreme obesity (Class III) Increased incidence by 254%  Patient's current BMI Ideal Body weight  There is no height or weight on file to calculate BMI. Patient weight not recorded   BMI Readings from Last 4 Encounters:  06/17/19 29.18 kg/m  06/04/19 29.18 kg/m  11/07/16 27.12 kg/m   Wt Readings from Last 4 Encounters:  06/17/19 170 lb (77.1 kg)  06/04/19 170 lb (77.1 kg)  11/07/16 158 lb (71.7 kg)    Psych/Mental status: Alert, oriented x 3 (person, place, & time)       Eyes: PERLA Respiratory: No evidence of acute respiratory distress  Assessment  Primary Diagnosis & Pertinent Problem  List: The primary encounter diagnosis was Chronic pain syndrome. Diagnoses of Pharmacologic therapy, Disorder of skeletal system, Problems influencing health status, and Abnormal MRI, cervical spine (06/05/2019) were also pertinent to this visit.  Visit Diagnosis (New problems to examiner): 1. Chronic pain syndrome   2. Pharmacologic therapy   3. Disorder of skeletal system   4. Problems influencing health status   5. Abnormal MRI, cervical spine (06/05/2019)    Plan of Care (Initial workup plan)  Note: Ms. Marice Guidone was reminded that as per protocol, today's visit has been an evaluation only. We have not taken over the patient's controlled substance management.  Problem-specific plan: Assessment and Plan            Lab Orders  No laboratory test(s) ordered today   Imaging Orders  No imaging studies ordered today   Referral Orders  No referral(s) requested today   Procedure Orders    No procedure(s) ordered today   Pharmacotherapy (current): Medications ordered:  No orders of the defined types were placed in this encounter.  Medications administered during this visit: Emerson D. Edsel Sanchez had no medications administered during this visit.   Analgesic Pharmacotherapy:  Opioid Analgesics: For patients currently taking or requesting to take opioid analgesics, in accordance with [place]  Medical Board Guidelines, we will assess their risks and indications for the use of these substances. After completing our evaluation, we may offer recommendations, but we no longer take patients for medication management. The prescribing physician will ultimately decide, based on his/her training and level of comfort whether to adopt any of the recommendations, including whether or not to prescribe such medicines.  Membrane stabilizer: To be determined at a later time  Muscle relaxant: To be determined at a later time  NSAID: To be determined at a later time  Other  analgesic(s): To be determined at a later time   Interventional management options: Ms. Bridgitte Felicetti was informed that there is no guarantee that she would be a candidate for interventional therapies. The decision will be based on the results of diagnostic studies, as well as Ms. Ferguson Howard's risk profile.  Procedure(s) under consideration:  Pending results of ordered studies     Interventional Therapies  Risk Factors  Considerations  Medical Comorbidities:     Planned  Pending:      Under consideration:   Pending   Completed: (Analgesic benefit)1  None at this time   Therapeutic  Palliative (PRN) options:   None established   Completed by other providers:   None reported  1(Analgesic benefit): Expressed in percentage (%). (Local anesthetic[LA] +/- sedation  L.A.Local Anesthetic  Steroid benefit  Ongoing benefit)   Provider-requested follow-up: No follow-ups on file.  Future Appointments  Date Time Provider Department Center  06/02/2024  9:00 AM Tanya Glisson, MD [facility] None   I discussed the assessment and treatment plan with the  patient. The patient was provided an opportunity to ask questions and all were answered. The patient agreed with the plan and demonstrated an understanding of the instructions.  Patient advised to call back or seek an in-person evaluation if the symptoms or condition worsens.  Duration of encounter: *** minutes.  Total time on encounter, as per AMA guidelines included both the face-to-face and non-face-to-face time personally spent by the physician and/or other qualified health care professional(s) on the day of the encounter (includes time in activities that require the physician or other qualified health care professional and does not include time in activities normally performed by clinical staff). Physician's time may include the following activities when performed: Preparing to see the patient (e.g., pre-charting review of  records, searching for previously ordered imaging, lab work, and nerve conduction tests) Review of prior analgesic pharmacotherapies. Reviewing PMP Interpreting ordered tests (e.g., lab work, imaging, nerve conduction tests) Performing post-procedure evaluations, including interpretation of diagnostic procedures Obtaining and/or reviewing separately obtained history Performing a medically appropriate examination and/or evaluation Counseling and educating the patient/family/caregiver Ordering medications, tests, or procedures Referring and communicating with other health care professionals (when not separately reported) Documenting clinical information in the electronic or other health record Independently interpreting results (not separately reported) and communicating results to the patient/ family/caregiver Care coordination (not separately reported)  Note by: Eric DELENA Como, MD (TTS and AI technology used. I apologize for any typographical errors that were not detected and corrected.) Date: 06/02/2024; Time: 5:59 PM

## 2024-06-01 NOTE — Patient Instructions (Signed)

## 2024-06-02 ENCOUNTER — Encounter: Payer: Self-pay | Admitting: Pain Medicine

## 2024-06-02 ENCOUNTER — Ambulatory Visit
Admission: RE | Admit: 2024-06-02 | Discharge: 2024-06-02 | Disposition: A | Discharge status: Home / Self Care | Source: Ambulatory Visit | Attending: Pain Medicine | Admitting: Pain Medicine

## 2024-06-02 ENCOUNTER — Ambulatory Visit: Admitting: Pain Medicine

## 2024-06-02 VITALS — BP 123/83 | HR 74 | Temp 97.4°F | Resp 16 | Ht 64.0 in | Wt 170.0 lb

## 2024-06-02 DIAGNOSIS — Z981 Arthrodesis status: Secondary | ICD-10-CM | POA: Diagnosis present

## 2024-06-02 DIAGNOSIS — M25511 Pain in right shoulder: Secondary | ICD-10-CM | POA: Diagnosis present

## 2024-06-02 DIAGNOSIS — G8929 Other chronic pain: Secondary | ICD-10-CM | POA: Insufficient documentation

## 2024-06-02 DIAGNOSIS — R200 Anesthesia of skin: Secondary | ICD-10-CM | POA: Insufficient documentation

## 2024-06-02 DIAGNOSIS — G8928 Other chronic postprocedural pain: Secondary | ICD-10-CM | POA: Insufficient documentation

## 2024-06-02 DIAGNOSIS — M5412 Radiculopathy, cervical region: Secondary | ICD-10-CM | POA: Insufficient documentation

## 2024-06-02 DIAGNOSIS — M549 Dorsalgia, unspecified: Secondary | ICD-10-CM | POA: Diagnosis present

## 2024-06-02 DIAGNOSIS — M542 Cervicalgia: Secondary | ICD-10-CM | POA: Insufficient documentation

## 2024-06-02 DIAGNOSIS — G4486 Cervicogenic headache: Secondary | ICD-10-CM

## 2024-06-02 DIAGNOSIS — M25512 Pain in left shoulder: Secondary | ICD-10-CM

## 2024-06-02 DIAGNOSIS — M899 Disorder of bone, unspecified: Secondary | ICD-10-CM

## 2024-06-02 DIAGNOSIS — M5382 Other specified dorsopathies, cervical region: Secondary | ICD-10-CM

## 2024-06-02 DIAGNOSIS — M4722 Other spondylosis with radiculopathy, cervical region: Secondary | ICD-10-CM

## 2024-06-02 DIAGNOSIS — Z789 Other specified health status: Secondary | ICD-10-CM | POA: Diagnosis present

## 2024-06-02 DIAGNOSIS — Z79899 Other long term (current) drug therapy: Secondary | ICD-10-CM

## 2024-06-02 DIAGNOSIS — R202 Paresthesia of skin: Secondary | ICD-10-CM

## 2024-06-02 DIAGNOSIS — M47812 Spondylosis without myelopathy or radiculopathy, cervical region: Secondary | ICD-10-CM | POA: Diagnosis present

## 2024-06-02 DIAGNOSIS — R937 Abnormal findings on diagnostic imaging of other parts of musculoskeletal system: Secondary | ICD-10-CM | POA: Insufficient documentation

## 2024-06-02 DIAGNOSIS — M5481 Occipital neuralgia: Secondary | ICD-10-CM

## 2024-06-02 DIAGNOSIS — G894 Chronic pain syndrome: Secondary | ICD-10-CM

## 2024-06-02 DIAGNOSIS — R519 Headache, unspecified: Secondary | ICD-10-CM | POA: Insufficient documentation

## 2024-06-02 DIAGNOSIS — Z9889 Other specified postprocedural states: Secondary | ICD-10-CM | POA: Diagnosis present

## 2024-06-03 LAB — COMP. METABOLIC PANEL (12)
AST: 24 IU/L (ref 0–40)
Albumin: 4.1 g/dL (ref 3.8–4.9)
Alkaline Phosphatase: 93 IU/L (ref 49–135)
BUN/Creatinine Ratio: 17 (ref 12–28)
BUN: 15 mg/dL (ref 8–27)
Bilirubin Total: 0.5 mg/dL (ref 0.0–1.2)
Calcium: 9.6 mg/dL (ref 8.7–10.3)
Chloride: 104 mmol/L (ref 96–106)
Creatinine, Ser: 0.9 mg/dL (ref 0.57–1.00)
Globulin, Total: 2.1 g/dL (ref 1.5–4.5)
Glucose: 84 mg/dL (ref 70–99)
Potassium: 4.5 mmol/L (ref 3.5–5.2)
Sodium: 142 mmol/L (ref 134–144)
Total Protein: 6.2 g/dL (ref 6.0–8.5)
eGFR: 73 mL/min/1.73 (ref >59)

## 2024-06-03 LAB — MAGNESIUM: Magnesium: 2.2 mg/dL (ref 1.6–2.3)

## 2024-06-06 LAB — COMPLIANCE DRUG ANALYSIS, UR

## 2024-06-08 ENCOUNTER — Ambulatory Visit: Admission: RE | Admit: 2024-06-08 | Discharge: 2024-06-08 | Attending: Pain Medicine | Admitting: Pain Medicine

## 2024-06-08 DIAGNOSIS — M5412 Radiculopathy, cervical region: Secondary | ICD-10-CM

## 2024-06-08 DIAGNOSIS — R200 Anesthesia of skin: Secondary | ICD-10-CM | POA: Insufficient documentation

## 2024-06-08 DIAGNOSIS — M549 Dorsalgia, unspecified: Secondary | ICD-10-CM | POA: Diagnosis present

## 2024-06-08 DIAGNOSIS — Z981 Arthrodesis status: Secondary | ICD-10-CM | POA: Insufficient documentation

## 2024-06-08 DIAGNOSIS — G894 Chronic pain syndrome: Secondary | ICD-10-CM | POA: Insufficient documentation

## 2024-06-08 DIAGNOSIS — G8929 Other chronic pain: Secondary | ICD-10-CM | POA: Insufficient documentation

## 2024-06-08 DIAGNOSIS — M5382 Other specified dorsopathies, cervical region: Secondary | ICD-10-CM | POA: Diagnosis present

## 2024-06-08 DIAGNOSIS — M542 Cervicalgia: Secondary | ICD-10-CM | POA: Diagnosis present

## 2024-06-08 DIAGNOSIS — M899 Disorder of bone, unspecified: Secondary | ICD-10-CM | POA: Diagnosis present

## 2024-06-08 DIAGNOSIS — R202 Paresthesia of skin: Secondary | ICD-10-CM | POA: Insufficient documentation

## 2024-06-08 DIAGNOSIS — G8928 Other chronic postprocedural pain: Secondary | ICD-10-CM | POA: Insufficient documentation

## 2024-06-08 DIAGNOSIS — M25512 Pain in left shoulder: Secondary | ICD-10-CM | POA: Insufficient documentation

## 2024-06-08 DIAGNOSIS — M47812 Spondylosis without myelopathy or radiculopathy, cervical region: Secondary | ICD-10-CM | POA: Insufficient documentation

## 2024-06-08 DIAGNOSIS — R519 Headache, unspecified: Secondary | ICD-10-CM | POA: Diagnosis present

## 2024-06-08 DIAGNOSIS — R937 Abnormal findings on diagnostic imaging of other parts of musculoskeletal system: Secondary | ICD-10-CM

## 2024-06-08 DIAGNOSIS — G4486 Cervicogenic headache: Secondary | ICD-10-CM | POA: Diagnosis present

## 2024-06-08 DIAGNOSIS — M25511 Pain in right shoulder: Secondary | ICD-10-CM | POA: Insufficient documentation

## 2024-06-08 DIAGNOSIS — M5481 Occipital neuralgia: Secondary | ICD-10-CM | POA: Insufficient documentation

## 2024-06-08 DIAGNOSIS — Z789 Other specified health status: Secondary | ICD-10-CM | POA: Diagnosis present

## 2024-06-08 DIAGNOSIS — Z9889 Other specified postprocedural states: Secondary | ICD-10-CM | POA: Diagnosis present

## 2024-06-09 ENCOUNTER — Ambulatory Visit

## 2024-07-30 ENCOUNTER — Telehealth: Admitting: Pain Medicine
